# Patient Record
Sex: Female | Born: 1975 | Race: Black or African American | Hispanic: No | Marital: Married | Smoking: Never smoker
Health system: Southern US, Community
[De-identification: ages and names within clinical notes are randomized; demographics above are authoritative.]

## PROBLEM LIST (undated history)

## (undated) DIAGNOSIS — I10 Essential (primary) hypertension: Secondary | ICD-10-CM

## (undated) DIAGNOSIS — N840 Polyp of corpus uteri: Secondary | ICD-10-CM

## (undated) DIAGNOSIS — N809 Endometriosis, unspecified: Secondary | ICD-10-CM

## (undated) DIAGNOSIS — M549 Dorsalgia, unspecified: Secondary | ICD-10-CM

## (undated) DIAGNOSIS — L509 Urticaria, unspecified: Secondary | ICD-10-CM

## (undated) DIAGNOSIS — M25552 Pain in left hip: Secondary | ICD-10-CM

## (undated) DIAGNOSIS — M25551 Pain in right hip: Secondary | ICD-10-CM

## (undated) HISTORY — DX: Pain in left hip: M25.552

## (undated) HISTORY — DX: Essential (primary) hypertension: I10

## (undated) HISTORY — DX: Pain in right hip: M25.551

## (undated) HISTORY — DX: Urticaria, unspecified: L50.9

## (undated) HISTORY — DX: Dorsalgia, unspecified: M54.9

---

## 1997-08-13 ENCOUNTER — Emergency Department (HOSPITAL_COMMUNITY): Admission: EM | Admit: 1997-08-13 | Discharge: 1997-08-13 | Payer: Self-pay | Admitting: Emergency Medicine

## 1998-01-06 ENCOUNTER — Other Ambulatory Visit: Admission: RE | Admit: 1998-01-06 | Discharge: 1998-01-06 | Payer: Self-pay | Admitting: Obstetrics and Gynecology

## 1998-02-10 ENCOUNTER — Emergency Department (HOSPITAL_COMMUNITY): Admission: EM | Admit: 1998-02-10 | Discharge: 1998-02-10 | Payer: Self-pay | Admitting: Emergency Medicine

## 1998-07-30 ENCOUNTER — Inpatient Hospital Stay (HOSPITAL_COMMUNITY): Admission: AD | Admit: 1998-07-30 | Discharge: 1998-07-30 | Payer: Self-pay | Admitting: Obstetrics & Gynecology

## 1998-07-31 ENCOUNTER — Inpatient Hospital Stay (HOSPITAL_COMMUNITY): Admission: AD | Admit: 1998-07-31 | Discharge: 1998-08-03 | Payer: Self-pay | Admitting: Obstetrics & Gynecology

## 1999-02-08 ENCOUNTER — Other Ambulatory Visit: Admission: RE | Admit: 1999-02-08 | Discharge: 1999-02-08 | Payer: Self-pay | Admitting: Obstetrics and Gynecology

## 1999-03-04 ENCOUNTER — Encounter: Payer: Self-pay | Admitting: *Deleted

## 1999-03-04 ENCOUNTER — Emergency Department (HOSPITAL_COMMUNITY): Admission: EM | Admit: 1999-03-04 | Discharge: 1999-03-04 | Payer: Self-pay | Admitting: *Deleted

## 1999-12-26 ENCOUNTER — Encounter: Admission: RE | Admit: 1999-12-26 | Discharge: 1999-12-26 | Payer: Self-pay | Admitting: Family Medicine

## 2000-01-08 ENCOUNTER — Encounter: Admission: RE | Admit: 2000-01-08 | Discharge: 2000-01-08 | Payer: Self-pay | Admitting: Family Medicine

## 2000-02-05 ENCOUNTER — Encounter: Admission: RE | Admit: 2000-02-05 | Discharge: 2000-02-05 | Payer: Self-pay | Admitting: Family Medicine

## 2000-03-05 ENCOUNTER — Other Ambulatory Visit: Admission: RE | Admit: 2000-03-05 | Discharge: 2000-03-05 | Payer: Self-pay | Admitting: Family Medicine

## 2000-03-05 ENCOUNTER — Encounter: Admission: RE | Admit: 2000-03-05 | Discharge: 2000-03-05 | Payer: Self-pay | Admitting: Family Medicine

## 2001-02-05 ENCOUNTER — Encounter: Admission: RE | Admit: 2001-02-05 | Discharge: 2001-02-05 | Payer: Self-pay | Admitting: Family Medicine

## 2001-02-23 ENCOUNTER — Encounter (INDEPENDENT_AMBULATORY_CARE_PROVIDER_SITE_OTHER): Payer: Self-pay | Admitting: *Deleted

## 2001-02-23 LAB — CONVERTED CEMR LAB

## 2001-02-26 ENCOUNTER — Encounter: Admission: RE | Admit: 2001-02-26 | Discharge: 2001-02-26 | Payer: Self-pay | Admitting: Family Medicine

## 2001-03-12 ENCOUNTER — Encounter: Admission: RE | Admit: 2001-03-12 | Discharge: 2001-03-12 | Payer: Self-pay | Admitting: Family Medicine

## 2001-07-30 ENCOUNTER — Encounter: Admission: RE | Admit: 2001-07-30 | Discharge: 2001-07-30 | Payer: Self-pay | Admitting: Family Medicine

## 2001-08-15 ENCOUNTER — Encounter: Admission: RE | Admit: 2001-08-15 | Discharge: 2001-08-15 | Payer: Self-pay | Admitting: Family Medicine

## 2003-12-21 ENCOUNTER — Encounter (INDEPENDENT_AMBULATORY_CARE_PROVIDER_SITE_OTHER): Payer: Self-pay | Admitting: Specialist

## 2003-12-21 ENCOUNTER — Ambulatory Visit (HOSPITAL_COMMUNITY): Admission: RE | Admit: 2003-12-21 | Discharge: 2003-12-21 | Payer: Self-pay | Admitting: Otolaryngology

## 2003-12-21 HISTORY — PX: TONSILLECTOMY: SUR1361

## 2003-12-27 ENCOUNTER — Observation Stay (HOSPITAL_COMMUNITY): Admission: EM | Admit: 2003-12-27 | Discharge: 2003-12-28 | Payer: Self-pay | Admitting: Emergency Medicine

## 2004-04-26 ENCOUNTER — Other Ambulatory Visit: Admission: RE | Admit: 2004-04-26 | Discharge: 2004-04-26 | Payer: Self-pay | Admitting: Family Medicine

## 2005-06-21 ENCOUNTER — Other Ambulatory Visit: Admission: RE | Admit: 2005-06-21 | Discharge: 2005-06-21 | Payer: Self-pay | Admitting: Family Medicine

## 2006-05-24 ENCOUNTER — Encounter (INDEPENDENT_AMBULATORY_CARE_PROVIDER_SITE_OTHER): Payer: Self-pay | Admitting: *Deleted

## 2006-09-24 ENCOUNTER — Other Ambulatory Visit: Admission: RE | Admit: 2006-09-24 | Discharge: 2006-09-24 | Payer: Self-pay | Admitting: Family Medicine

## 2010-08-11 NOTE — H&P (Signed)
NAME:  [name], [name] NO.:  [id number]   MEDICAL RECORD NO.:  [id number]          PATIENT TYPE:  OBV   LOCATION:  1830                         FACILITY:  [facility]   PHYSICIAN:  Alfonse Flavors, M.D.    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   HISTORY OF PRESENT ILLNESS:  Ms. Dareen Piano underwent a tonsillectomy by Dr.  Hermelinda Medicus six days ago.  She had the onset of bleeding tonight.  She  had been bleeding approximately two hours when seen in the emergency room.  She felt that she had bleed about 200 mL of blood.  She does not have any  other history of increased bleeding diathesis.   PAST SURGICAL HISTORY:  1.  Cesarean section.  2.  Tonsillectomy.   MEDICATIONS:  Ibuprofen, Lortab.   ALLERGIES:  PENICILLIN.   REVIEW OF SYMPTOMS:  Ms. Dareen Piano denies having any other current health  problems.   PHYSICAL EXAMINATION:  VITAL SIGNS:  Temperature was 100.4, blood pressure  is 148/103.  HEENT:  Tympanic membranes are normal bilaterally.  Examination of the  oropharynx showed clotted blood in the right tonsillar fossa.  CHEST:  Clear.  HEART:  S1 and S2 normal.  No murmur or gallop.  ABDOMEN:  Soft and nontender.  EXTREMITIES:  Normal.   IMPRESSION:  Tonsillar bleeding.   RECOMMENDATIONS:  Control of tonsillar bleeding under general anesthesia.       JCM/MEDQ  D:  12/27/2003  T:  12/28/2003  Job:  [id number]

## 2010-08-11 NOTE — Op Note (Signed)
NAME:  Marissa [name], Marissa [name]             ACCOUNT NO.:  [id number]   MEDICAL RECORD NO.:  [id number]          PATIENT TYPE:  OIB   LOCATION:  NA                           FACILITY:  [facility]   PHYSICIAN:  Hermelinda Medicus, M.D.   DATE OF BIRTH:  [birth year]   DATE OF PROCEDURE:  12/21/2003  DATE OF DISCHARGE:                                 OPERATIVE REPORT   PREOPERATIVE DIAGNOSIS:  Tonsillitis with tonsillar hypertrophy, with severe  snoring.   POSTOPERATIVE DIAGNOSES:  Tonsillitis with tonsillar hypertrophy, with  severe snoring.   OPERATION:  Tonsillectomy.   ANESTHESIA:  General endotracheal with Dr. Kaylyn Layer. [place].   SURGEON:  Hermelinda Medicus, M.D.   DESCRIPTION OF PROCEDURE:  The patient was placed in the supine position.  Under general endotracheal anesthesia the tonsillar gag was placed.  The  patient's tonsils which were exudative and protruding were evaluated.  The  tonsil was grasped using a tenaculum.  The Bovie electrocoagulation and  blunt dissection was used to begin the dissection, and then all hemostasis  was established with Bovie electrocoagulation as this dissection was  continued.  The tonsil was slowly removed using the blunt dissection and the  electrocoagulation on the left, and similarly on the right.  These were  separate and sent for microscopic evaluation.  The tonsillar beds were  carefully evaluated and made sure that all hemostasis was established.  The  stomach was suctioned using a #18 [place] sump, and then the patient was  awakened.  The patient tolerated the procedure well, and is doing well postoperatively.       JC/MEDQ  D:  12/21/2003  T:  12/21/2003  Job:  [id number]   cc:   Dr. [facility]

## 2010-08-11 NOTE — H&P (Signed)
NAME:  Marissa [name], LOS NO.:  [id number]   MEDICAL RECORD NO.:  [id number]          PATIENT TYPE:  OIB   LOCATION:  NA                           FACILITY:  [facility]   PHYSICIAN:  Hermelinda Medicus, M.D.   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 She is 35 years old  and she has had considerable tonsillitis in the past going back to her  teenage years. 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 She has been on multiple antibiotics in the past. She  has now developed an allergy to PENICILLIN and gets hives from this. She  says the only thing that works for her is Avelox or Levaquin, which she is  taking right now. Under my care she has developed a course of tonsillitis.  She also has had a history of severe snoring. She has not had a sleep apnea  study, a polysomnogram, because her family states that she is not truly  obstructing, but the snoring is also waking her up, interrupting her sleep  causing considerable discomfort.   PAST MEDICAL HISTORY:  PENICILLIN allergy with hives. She does not smoke and  does not drink. She had a Cesarean section in 1995. She did not have any  pulmonary, GI, or GU problems.   She is a well-nourished, well-developed female. She does have somewhat of a  labile hypertension-type problem where she comes in with a blood pressure of  120/80 and 118/60 in the office. She says it has been as high as 170/100,  and today before surgery she is 149/95.  She weighs 176 and is 61 inches.  Her ears are clear.  Tympanic membranes are clear. The nose is clear. No  ulceration or mass. The throat is clear except for exudative tonsils with  light cheesy-type debris in the tonsillar crypts. The larynx is clear.  True  cords and false cords, epiglottis, and facial nerves are clear without any  ulceration or mass. Her neck is free of any thyromegaly or cervical  adenopathy, or mass. Chest is clear with no rales, rhonchi, or wheezes.  Cardiovascular without murmurs, rubs, or gallops. True cord with bloody gag  reflex.  No bloody EOMs. Facial nerve and shoulder strength were all  symmetrical on radial nerve testing.   INITIAL DIAGNOSIS:  Tonsillitis with severe snoring with a history of  persistent tonsillitis unresolving on antibiotics.       JC/MEDQ  D:  12/21/2003  T:  12/21/2003  Job:  [id number]   cc:   [facility]   Dr. Kinnie Feil of [facility]

## 2010-08-11 NOTE — Op Note (Signed)
NAME:  Marissa [name], Marissa [name] NO.:  [id number]   MEDICAL RECORD NO.:  [id number]          PATIENT TYPE:  OBV   LOCATION:  5019                         FACILITY:  [facility]   PHYSICIAN:  Alfonse Flavors, M.D.    DATE OF BIRTH:  1975/04/14   DATE OF PROCEDURE:  12/27/2003  DATE OF DISCHARGE:                                 OPERATIVE REPORT   INDICATION AND JUSTIFICATION FOR PROCEDURE:  Remmi Armenteros is a 35-year-  old patient who underwent a tonsillectomy by Dr. Hermelinda Medicus 6 days ago.  She contacted me tonight as the on-call physician to report that she has had  2 hours of bleeding.  She was seen in the emergency room and was noted to  have a clot in the right tonsillar fossa and to be spitting out bright red  blood.  She was a candidate for control of tonsillar bleeding under general  anesthesia and the indications and complications, including persistent  bleeding, were discussed with her and operative permit was obtained.   PREOPERATIVE DIAGNOSIS:  Tonsillar bleeding.   POSTOPERATIVE DIAGNOSIS:  Tonsillar bleeding.   PROCEDURE PERFORMED:  Control of right tonsillar bleeding.   SURGEON:  Alfonse Flavors, M.D.   ANESTHESIA:  General endotracheal.   DESCRIPTION:  Ms. Dareen Piano was brought to the operating room and placed  supine on the operating table.  She was induced with general anesthesia and  intubated with an orotracheal tube.  Her face was draped in a sterile  fashion and mouth was opened with a Crowe-Davis mouth gag.  Examination of  the oropharynx showed a large amount of clotted blood in the right tonsillar  fossa; this material was removed with suction.  She had moderate oozing from  the inferior fossa which was controlled with suction cautery.  There was a  small protuberant area in the midportion of the tonsillar fossa.  This was  cleaned with suction and bright red arterial bleeding occurred.  This  bleeding was also controlled with suction cautery.  The  pharynx was  suctioned free of debris.  There did not appear to be any bleeding from the  left side.  The tonsillar fossa was abraded with the tip of a finger and  suction cautery; no further bleeding could be induced.  The hypopharynx was  cleaned with suction and a nasogastric tube was passed into the stomach and  the gastric contents were evacuated.  Ms. Anderson tolerated the procedure  well and was taken to the recovery area in satisfactory condition.   FOLLOWUP CARE:  Ms. Dareen Piano will be admitted for overnight observation.  Her discharge is anticipated in the morning.  She will continue medications  as prescribed by Dr. Haroldine Laws.      JCM/MEDQ  D:  12/27/2003  T:  12/28/2003  Job:  [id number]

## 2012-06-23 ENCOUNTER — Ambulatory Visit (INDEPENDENT_AMBULATORY_CARE_PROVIDER_SITE_OTHER): Payer: 59 | Admitting: Obstetrics & Gynecology

## 2012-06-25 ENCOUNTER — Ambulatory Visit (INDEPENDENT_AMBULATORY_CARE_PROVIDER_SITE_OTHER): Payer: 59 | Admitting: Obstetrics & Gynecology

## 2012-06-25 ENCOUNTER — Encounter: Payer: Self-pay | Admitting: Obstetrics & Gynecology

## 2012-06-25 VITALS — BP 124/89 | HR 89 | Temp 97.9°F | Ht 61.0 in | Wt 170.0 lb

## 2012-06-25 DIAGNOSIS — Z30432 Encounter for removal of intrauterine contraceptive device: Secondary | ICD-10-CM

## 2012-06-25 DIAGNOSIS — I10 Essential (primary) hypertension: Secondary | ICD-10-CM | POA: Insufficient documentation

## 2012-06-25 DIAGNOSIS — Z309 Encounter for contraceptive management, unspecified: Secondary | ICD-10-CM

## 2012-06-25 MED ORDER — ETONOGESTREL-ETHINYL ESTRADIOL 0.12-0.015 MG/24HR VA RING: VAGINAL_RING | VAGINAL | Status: DC

## 2012-06-25 NOTE — Patient Instructions (Signed)

## 2012-06-25 NOTE — Progress Notes (Signed)
.   Subjective:     Marissa [name] is a 37 y.o. female here for a routine exam.  She would like to have her Mirena IUD removed today - it was inserted 5 years ago.  No other complaints at this time.  Personal health questionnaire reviewed: no.   Gynecologic History No LMP recorded. Patient is not currently having periods (Reason: IUD). Contraception: Mirena Last Pap: 2013 Results were: normal Last mammogram: N/A  Obstetric History OB History   Grav Para Term Preterm Abortions TAB SAB Ect Mult Living   3 3 3       3      # Outc Date GA Lbr Len/2nd Wgt Sex Del Anes PTL Lv   1 TRM 9/95 [redacted]w[redacted]d  7lb6oz(3.345kg) M LTCS   Yes   Comments: Low heart rate during contraction   2 TRM 5/00 [redacted]w[redacted]d  9lb4oz(4.196kg) M SVD EPI  Yes   3 TRM 4/03 [redacted]w[redacted]d  7lb12oz(3.515kg) M SVD EPI  Yes       The following portions of the patient's history were reviewed and updated as appropriate: allergies, current medications, past family history, past medical history, past social history, past surgical history and problem list.  Review of Systems Pertinent items are noted in HPI.    Objective:     SPEC:  IUD strings seen.  IUD removed intact.     Assessment:   S/P IUD removal  Plan:    Contraception: Paediatric nurse.   Re-check B/P in several mths Recommend folic acid

## 2012-07-03 ENCOUNTER — Institutional Professional Consult (permissible substitution): Payer: Self-pay | Admitting: Cardiovascular Disease

## 2012-07-16 ENCOUNTER — Ambulatory Visit: Payer: Self-pay | Admitting: Obstetrics & Gynecology

## 2012-12-29 ENCOUNTER — Ambulatory Visit: Payer: 59 | Admitting: Obstetrics & Gynecology

## 2013-01-02 ENCOUNTER — Ambulatory Visit: Payer: 59 | Admitting: Advanced Practice Midwife

## 2013-01-23 ENCOUNTER — Ambulatory Visit: Payer: 59 | Admitting: Advanced Practice Midwife

## 2013-03-13 ENCOUNTER — Ambulatory Visit (INDEPENDENT_AMBULATORY_CARE_PROVIDER_SITE_OTHER): Payer: BC Managed Care – PPO | Admitting: Advanced Practice Midwife

## 2013-03-13 ENCOUNTER — Encounter: Payer: Self-pay | Admitting: Advanced Practice Midwife

## 2013-03-13 VITALS — BP 144/100 | HR 76 | Temp 98.3°F | Wt 182.0 lb

## 2013-03-13 DIAGNOSIS — Z01419 Encounter for gynecological examination (general) (routine) without abnormal findings: Secondary | ICD-10-CM

## 2013-03-13 DIAGNOSIS — Z98891 History of uterine scar from previous surgery: Secondary | ICD-10-CM

## 2013-03-13 DIAGNOSIS — Z3169 Encounter for other general counseling and advice on procreation: Secondary | ICD-10-CM | POA: Insufficient documentation

## 2013-03-13 NOTE — Progress Notes (Signed)
Pt is in office for abnormal bleeding. Pt is currently trying to conceive and it concerned about bleeding and ovulation.

## 2013-03-13 NOTE — Progress Notes (Signed)
2Subjective:     Marissa [name] is a 37 y.o. female and is here for a comprehensive physical exam. The patient reports bleeding following intercourse on day 11 of cycle.  Patient currently trying to conceive. She is in a new marriage. She has 3 children (youngest 10yo) and her husband has 1 (3yo). They have not been preventing for 6 months and actively trying for the past 3 months. She reports recent bleeding after IC for 1 day, then went back to normal. Her cycles vary from 21-27 days in duration. She denies any premenopausal symptoms. Denies other concerns.  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   History   Social History  . Marital Status: Single    Spouse Name: N/A    Number of Children: N/A  . Years of Education: N/A   Occupational History  . Not on file.   Social History Main Topics  . Smoking status: Never Smoker   . Smokeless tobacco: Not on file  . Alcohol Use: Yes     Comment: occasional  . Drug Use: No  . Sexual Activity: Yes    Birth Control/ Protection: None   Other Topics Concern  . Not on file   Social History Narrative  . No narrative on file   Health Maintenance  Topic Date Due  . Tetanus/tdap  02/27/1996  . Pap Smear  02/24/2004  . Influenza Vaccine  10/24/2012    The following portions of the patient's history were reviewed and updated as appropriate: allergies, current medications, past family history, past medical history, past social history, past surgical history and problem list.  Review of Systems A comprehensive review of systems was negative.   Objective:    BP 144/100  Pulse 76  Temp(Src) 98.3 F (36.8 C)  Wt 182 lb (82.555 kg)  LMP 02/25/2013 General appearance: alert and cooperative Head: Normocephalic, without obvious abnormality, atraumatic Eyes: conjunctivae/corneas clear. PERRL, EOM's intact. Fundi benign. Ears: normal TM's and external ear canals both ears Nose: Nares normal. Septum midline. Mucosa normal. No  drainage or sinus tenderness. Throat: lips, mucosa, and tongue normal; teeth and gums normal Neck: no adenopathy, no carotid bruit, no JVD, supple, symmetrical, trachea midline and thyroid not enlarged, symmetric, no tenderness/mass/nodules Back: symmetric, no curvature. ROM normal. No CVA tenderness. Lungs: clear to auscultation bilaterally Breasts: normal appearance, no masses or tenderness Heart: regular rate and rhythm, S1, S2 normal, no murmur, click, rub or gallop Abdomen: soft, non-tender; bowel sounds normal; no masses,  no organomegaly Pelvic: cervix normal in appearance, external genitalia normal, no adnexal masses or tenderness, no cervical motion tenderness, rectovaginal septum normal, uterus normal size, shape, and consistency and vagina normal without discharge 3 Nabothian Cyst on cervix 2 @ 1 o'clock and 1 @ 3 o'clock. Cervix friable w/ pap smear Extremities: extremities normal, atraumatic, no cyanosis or edema Pulses: 2+ and symmetric Skin: Skin color, texture, turgor normal. No rashes or lesions Neurologic: Grossly normal    Assessment:    Healthy female exam.  Advanced Maternal Age Hypertension Desires Fertility Previous C-section w/ 2 subsequent VBAC Friable cervix     Plan:     See After Visit Summary for Counseling Recommendations  Encouraged patient to take HTN medication, Category B Encouraged PNV (patient taking) Discussed FAM, discussed methods to help w/ success of fertility Patient to RTC for evaluation if not successful after 6 months of active trying. Pap, GC/Ct today Patient may RTC for lab work for preventative labs CBC, CMP,  TSH, Cholesterol Discussed high risk pregnancy to patient including AMA, HTN.  40 min spent with patient greater than 80% spent in counseling and coordination of care.   Sybel Standish Wilson Singer CNM

## 2013-03-14 LAB — GC/CHLAMYDIA PROBE AMP
CT Probe RNA: NEGATIVE
GC Probe RNA: NEGATIVE

## 2013-03-16 LAB — PAP IG AND HPV HIGH-RISK: HPV DNA High Risk: NOT DETECTED

## 2013-03-27 ENCOUNTER — Encounter: Payer: Self-pay | Admitting: Advanced Practice Midwife

## 2013-05-01 ENCOUNTER — Encounter: Payer: Self-pay | Admitting: Advanced Practice Midwife

## 2013-05-01 ENCOUNTER — Ambulatory Visit (INDEPENDENT_AMBULATORY_CARE_PROVIDER_SITE_OTHER): Payer: BC Managed Care – PPO | Admitting: Advanced Practice Midwife

## 2013-05-01 VITALS — BP 126/84 | HR 69 | Temp 98.1°F | Ht 60.0 in | Wt 181.0 lb

## 2013-05-01 DIAGNOSIS — IMO0002 Reserved for concepts with insufficient information to code with codable children: Secondary | ICD-10-CM

## 2013-05-01 DIAGNOSIS — Z01419 Encounter for gynecological examination (general) (routine) without abnormal findings: Secondary | ICD-10-CM

## 2013-05-01 DIAGNOSIS — N939 Abnormal uterine and vaginal bleeding, unspecified: Secondary | ICD-10-CM | POA: Insufficient documentation

## 2013-05-01 DIAGNOSIS — N926 Irregular menstruation, unspecified: Secondary | ICD-10-CM

## 2013-05-01 LAB — TRIGLYCERIDES: TRIGLYCERIDES: 59 mg/dL (ref <150)

## 2013-05-01 LAB — CBC WITH DIFFERENTIAL/PLATELET
BASOS ABS: 0.1 10*3/uL (ref 0.0–0.1)
Basophils Relative: 1 % (ref 0–1)
EOS PCT: 2 % (ref 0–5)
Eosinophils Absolute: 0.2 10*3/uL (ref 0.0–0.7)
HEMATOCRIT: 41.4 % (ref 36.0–46.0)
HEMOGLOBIN: 13.9 g/dL (ref 12.0–15.0)
LYMPHS ABS: 2.1 10*3/uL (ref 0.7–4.0)
LYMPHS PCT: 27 % (ref 12–46)
MCH: 29.6 pg (ref 26.0–34.0)
MCHC: 33.6 g/dL (ref 30.0–36.0)
MCV: 88.3 fL (ref 78.0–100.0)
MONOS PCT: 6 % (ref 3–12)
Monocytes Absolute: 0.4 10*3/uL (ref 0.1–1.0)
NEUTROS ABS: 5 10*3/uL (ref 1.7–7.7)
Neutrophils Relative %: 64 % (ref 43–77)
Platelets: 286 10*3/uL (ref 150–400)
RBC: 4.69 MIL/uL (ref 3.87–5.11)
RDW: 13.8 % (ref 11.5–15.5)
WBC: 7.7 10*3/uL (ref 4.0–10.5)

## 2013-05-01 LAB — LDL CHOLESTEROL, DIRECT: Direct LDL: 114 mg/dL — ABNORMAL HIGH

## 2013-05-01 LAB — COMPREHENSIVE METABOLIC PANEL
ALBUMIN: 4.2 g/dL (ref 3.5–5.2)
ALT: 11 U/L (ref 0–35)
AST: 12 U/L (ref 0–37)
Alkaline Phosphatase: 70 U/L (ref 39–117)
BUN: 9 mg/dL (ref 6–23)
CHLORIDE: 103 meq/L (ref 96–112)
CO2: 26 mEq/L (ref 19–32)
Calcium: 9.4 mg/dL (ref 8.4–10.5)
Creat: 0.74 mg/dL (ref 0.50–1.10)
Glucose, Bld: 71 mg/dL (ref 70–99)
POTASSIUM: 4.2 meq/L (ref 3.5–5.3)
Sodium: 140 mEq/L (ref 135–145)
Total Bilirubin: 0.5 mg/dL (ref 0.2–1.2)
Total Protein: 7.2 g/dL (ref 6.0–8.3)

## 2013-05-01 LAB — HDL CHOLESTEROL: HDL: 52 mg/dL (ref >39)

## 2013-05-01 LAB — TSH: TSH: 1.42 u[IU]/mL (ref 0.350–4.500)

## 2013-05-01 LAB — T3, FREE: T3, Free: 2.9 pg/mL (ref 2.3–4.2)

## 2013-05-01 LAB — POCT URINE PREGNANCY: Preg Test, Ur: NEGATIVE

## 2013-05-01 LAB — T4, FREE: FREE T4: 1.08 ng/dL (ref 0.80–1.80)

## 2013-05-01 LAB — CHOLESTEROL, TOTAL: Cholesterol: 178 mg/dL (ref 0–200)

## 2013-05-01 NOTE — Progress Notes (Signed)
Subjective:     Marissa [name] is a 38 y.o. female here for a routine exam.  Current complaints: Pt states that she has had abnormal bleeding this past cycle.  Pt states that this is the first occurrence. Pt states that her cycles are regular, 23-25 days period that last for approx 5 days.  Pt states that her cycle started a few days early and lasted for 6 days.  Pt states she then had 2 days with no bleeding and had intercourse.  Pt then started cycle again on the third day.  Pt states that she did have some pain during intercourse.  Pt denies any other vaginal or urinary problems.  Pt states that she is trying to conceive and would like to discuss.  Personal health questionnaire reviewed: yes.  [name] reported new onset of pain w/ bleeding. Concerned with abnormal bleeding, pain and trying to conceive.    Gynecologic History Patient's last menstrual period was 04/14/2013. Contraception: none Last Pap: 02/2013. Results were: normal  Obstetric History OB History  Gravida Para Term Preterm AB SAB TAB Ectopic Multiple Living  3 3 3       3     # Outcome Date GA Lbr Len/2nd Weight Sex Delivery Anes PTL Lv  3 TRM 06/30/01 [redacted]w[redacted]d  7 lb 12 oz (3.515 kg) M SVD EPI  Y  2 TRM 08/02/98 [redacted]w[redacted]d  9 lb 4 oz (4.196 kg) M SVD EPI  Y  1 TRM 12/01/93 [redacted]w[redacted]d  7 lb 6 oz (3.345 kg) M LTCS   Y     Comments: Low heart rate during contraction       The following portions of the patient's history were reviewed and updated as appropriate: allergies, current medications, past family history, past medical history, past social history, past surgical history and problem list.  Review of Systems A comprehensive review of systems was negative except for: Constitutional: positive for fatigue Genitourinary: positive for abnormal menstrual periods and dysparunia Behavioral/Psych: positive for tired and fatigued    Objective:    BP 126/84  Pulse 69  Temp(Src) 98.1 F (36.7 C)  Ht 5' (1.524 m)  Wt 181 lb (82.101  kg)  BMI 35.35 kg/m2  LMP 04/14/2013 General appearance: alert and cooperative    Assessment:   Patient Active Problem List   Diagnosis Date Noted  . Abnormal uterine bleeding 05/01/2013  . Encounter for preconception consultation 03/13/2013  . H/O: C-section 03/13/2013  . Essential hypertension 06/25/2012  Trying to conceive   Plan:    Follow up in: 2 months.   Pelvic US at this time.  TSH and wellness labs drawn (fasting) Patient to RTC to meet w/ MD to discuss further work up if she continue having bleeding.  Follow PCP for hypertension.  See previous notes  20 min spent with patient greater than 80% spent in counseling and coordination of care.   Shiva Karis Roni Bread CNM

## 2013-05-08 ENCOUNTER — Ambulatory Visit (HOSPITAL_COMMUNITY)
Admission: RE | Admit: 2013-05-08 | Discharge: 2013-05-08 | Disposition: A | Discharge status: Home / Self Care | Payer: BC Managed Care – PPO | Source: Ambulatory Visit | Attending: Advanced Practice Midwife | Admitting: Advanced Practice Midwife

## 2013-05-08 ENCOUNTER — Ambulatory Visit (HOSPITAL_COMMUNITY): Payer: BC Managed Care – PPO

## 2013-05-08 ENCOUNTER — Other Ambulatory Visit: Payer: Self-pay | Admitting: Advanced Practice Midwife

## 2013-05-08 DIAGNOSIS — D250 Submucous leiomyoma of uterus: Secondary | ICD-10-CM | POA: Insufficient documentation

## 2013-05-08 DIAGNOSIS — N939 Abnormal uterine and vaginal bleeding, unspecified: Secondary | ICD-10-CM

## 2013-05-08 DIAGNOSIS — N938 Other specified abnormal uterine and vaginal bleeding: Secondary | ICD-10-CM | POA: Insufficient documentation

## 2013-05-08 DIAGNOSIS — I10 Essential (primary) hypertension: Secondary | ICD-10-CM | POA: Insufficient documentation

## 2013-05-08 DIAGNOSIS — N949 Unspecified condition associated with female genital organs and menstrual cycle: Secondary | ICD-10-CM | POA: Insufficient documentation

## 2013-05-08 DIAGNOSIS — N83209 Unspecified ovarian cyst, unspecified side: Secondary | ICD-10-CM | POA: Insufficient documentation

## 2013-05-19 ENCOUNTER — Encounter: Payer: Self-pay | Admitting: Advanced Practice Midwife

## 2013-05-19 DIAGNOSIS — Z3490 Encounter for supervision of normal pregnancy, unspecified, unspecified trimester: Secondary | ICD-10-CM | POA: Insufficient documentation

## 2013-05-19 DIAGNOSIS — N83201 Unspecified ovarian cyst, right side: Secondary | ICD-10-CM | POA: Insufficient documentation

## 2013-05-20 ENCOUNTER — Inpatient Hospital Stay (HOSPITAL_COMMUNITY): Payer: BC Managed Care – PPO

## 2013-05-20 ENCOUNTER — Inpatient Hospital Stay (HOSPITAL_COMMUNITY)
Admission: AD | Admit: 2013-05-20 | Discharge: 2013-05-20 | Disposition: A | Discharge status: Home / Self Care | Payer: BC Managed Care – PPO | Source: Ambulatory Visit | Attending: Obstetrics & Gynecology | Admitting: Obstetrics & Gynecology

## 2013-05-20 ENCOUNTER — Encounter (HOSPITAL_COMMUNITY): Payer: Self-pay

## 2013-05-20 DIAGNOSIS — R1031 Right lower quadrant pain: Secondary | ICD-10-CM | POA: Insufficient documentation

## 2013-05-20 DIAGNOSIS — O039 Complete or unspecified spontaneous abortion without complication: Secondary | ICD-10-CM

## 2013-05-20 LAB — WET PREP, GENITAL
Clue Cells Wet Prep HPF POC: NONE SEEN
TRICH WET PREP: NONE SEEN
YEAST WET PREP: NONE SEEN

## 2013-05-20 LAB — ABO/RH: ABO/RH(D): B POS

## 2013-05-20 LAB — POCT PREGNANCY, URINE: Preg Test, Ur: NEGATIVE

## 2013-05-20 MED ORDER — LIDOCAINE 1%/NA BICARB 0.1 MEQ INJECTION
INJECTION | INTRAVENOUS | Status: AC
  Filled 2013-05-20: qty 1

## 2013-05-20 NOTE — MAU Provider Note (Signed)
History     CSN: [id number]  Arrival date and time: 05/20/13 9528   First Provider Initiated Contact with Patient 05/20/13 1005      Chief Complaint  Patient presents with  . Abdominal Pain  . Vaginal Bleeding   Abdominal Pain Pertinent negatives include no fever, nausea or vomiting.  Vaginal Bleeding Associated symptoms include abdominal pain (RLQ). Pertinent negatives include no chills, fever, nausea or vomiting.   This is a 38 y.o. female at [redacted]w[redacted]d by LMP who presents with c/o bleeding and RLQ pain, which both started today.   Just had a qualitative pregnancy test done at her family doctor this week. It was positive.  Did not do a quantitative test.   RN Note:   Patient states she had a positive blood pregnancy test in the office on 2-23. Started having heavy vaginal bleeding and pain on the right lower side.        OB History   Grav Para Term Preterm Abortions TAB SAB Ect Mult Living   4 3 3       3       Past Medical History  Diagnosis Date  . Hypertension     Past Surgical History  Procedure Laterality Date  . Cesarean section    . Tonsillectomy      Family History  Problem Relation Age of Onset  . Hypertension Paternal Grandmother   . Stroke Paternal Grandmother     History  Substance Use Topics  . Smoking status: Never Smoker   . Smokeless tobacco: Not on file  . Alcohol Use: Yes     Comment: occasional    Allergies:  Allergies  Allergen Reactions  . Penicillins Hives    Prescriptions prior to admission  Medication Sig Dispense Refill  . Biotin ([employer] BIOTIN HIGH POTENCY) 1000 MCG tablet Take 1,000 mcg by mouth daily.      Marland Kitchen etonogestrel-ethinyl estradiol (NUVARING) 0.12-0.015 MG/24HR vaginal ring Insert vaginally and leave in place for 3 consecutive weeks, then remove for 1 week.  1 each  12  . hydrochlorothiazide (HYDRODIURIL) 25 MG tablet Take 25 mg by mouth daily.      . Multiple Vitamins-Minerals (MULTIVITAMIN WITH MINERALS) tablet Take  1 tablet by mouth daily.        Review of Systems  Constitutional: Negative for fever, chills and malaise/fatigue.  Gastrointestinal: Positive for abdominal pain (RLQ). Negative for nausea and vomiting.  Genitourinary: Positive for vaginal bleeding.       Vaginal bleeding with clots    Physical Exam   Blood pressure 139/77, pulse 73, temperature 98.8 F (37.1 C), temperature source Oral, resp. rate 16, height 5\' 1"  (1.549 m), weight 82.101 kg (181 lb), last menstrual period 04/14/2013, SpO2 100.00%.  Physical Exam  Constitutional: She is oriented to person, place, and time. She appears well-developed and well-nourished. No distress.  Cardiovascular: Normal rate.   Respiratory: Effort normal.  GI: Soft. She exhibits no distension and no mass. There is tenderness (Right lower quadrant, no CMT, uterus nontender). There is no rebound and no guarding.  Genitourinary: Uterus normal. Vaginal discharge (moderate blood in vault) found.  Musculoskeletal: Normal range of motion.  Neurological: She is alert and oriented to person, place, and time.  Skin: Skin is warm and dry.  Psychiatric: She has a normal mood and affect.    MAU Course  Procedures  MDM Cultures done, will check labs and [place].  Quant HCG was <1.  Assessment and Plan  A:  Complete  Spontaneous Abortion       Negative blood pregnancy test today  P: Discussed results.      Quant was probably low earlier in the week      Followup in office  [facility] 05/20/2013, 10:12 AM

## 2013-05-20 NOTE — MAU Note (Signed)
Patient states she had a positive blood pregnancy test in the office on 2-23. Started having heavy vaginal bleeding and pain on the right lower side.

## 2013-05-20 NOTE — Discharge Instructions (Signed)
Miscarriage A miscarriage is the sudden loss of an unborn baby (fetus) before the 20th week of pregnancy. Most miscarriages happen in the first 3 months of pregnancy. Sometimes, it happens before a woman even knows she is pregnant. A miscarriage is also called a "spontaneous miscarriage" or "early pregnancy loss." Having a miscarriage can be an emotional experience. Talk with your caregiver about any questions you may have about miscarrying, the grieving process, and your future pregnancy plans. CAUSES   Problems with the fetal chromosomes that make it impossible for the baby to develop normally. Problems with the baby's genes or chromosomes are most often the result of errors that occur, by chance, as the embryo divides and grows. The problems are not inherited from the parents.  Infection of the cervix or uterus.   Hormone problems.   Problems with the cervix, such as having an incompetent cervix. This is when the tissue in the cervix is not strong enough to hold the pregnancy.   Problems with the uterus, such as an abnormally shaped uterus, uterine fibroids, or congenital abnormalities.   Certain medical conditions.   Smoking, drinking alcohol, or taking illegal drugs.   Trauma.  Often, the cause of a miscarriage is unknown.  SYMPTOMS   Vaginal bleeding or spotting, with or without cramps or pain.  Pain or cramping in the abdomen or lower back.  Passing fluid, tissue, or blood clots from the vagina. DIAGNOSIS  Your caregiver will perform a physical exam. You may also have an ultrasound to confirm the miscarriage. Blood or urine tests may also be ordered. TREATMENT   Sometimes, treatment is not necessary if you naturally pass all the fetal tissue that was in the uterus. If some of the fetus or placenta remains in the body (incomplete miscarriage), tissue left behind may become infected and must be removed. Usually, a dilation and curettage (D and C) procedure is performed.  During a D and C procedure, the cervix is widened (dilated) and any remaining fetal or placental tissue is gently removed from the uterus.  Antibiotic medicines are prescribed if there is an infection. Other medicines may be given to reduce the size of the uterus (contract) if there is a lot of bleeding.  If you have Rh negative blood and your baby was Rh positive, you will need a Rh immunoglobulin shot. This shot will protect any future baby from having Rh blood problems in future pregnancies. HOME CARE INSTRUCTIONS   Your caregiver may order bed rest or may allow you to continue light activity. Resume activity as directed by your caregiver.  Have someone help with home and family responsibilities during this time.   Keep track of the number of sanitary pads you use each day and how soaked (saturated) they are. Write down this information.   Do not use tampons. Do not douche or have sexual intercourse until approved by your caregiver.   Only take over-the-counter or prescription medicines for pain or discomfort as directed by your caregiver.   Do not take aspirin. Aspirin can cause bleeding.   Keep all follow-up appointments with your caregiver.   If you or your partner have problems with grieving, talk to your caregiver or seek counseling to help cope with the pregnancy loss. Allow enough time to grieve before trying to get pregnant again.  SEEK IMMEDIATE MEDICAL CARE IF:   You have severe cramps or pain in your back or abdomen.  You have a fever.  You pass large blood clots (walnut-sized  or larger) ortissue from your vagina. Save any tissue for your caregiver to inspect.   Your bleeding increases.   You have a thick, bad-smelling vaginal discharge.  You become lightheaded, weak, or you faint.   You have chills.  MAKE SURE YOU:  Understand these instructions.  Will watch your condition.  Will get help right away if you are not doing well or get  worse. Document Released: 09/05/2000 Document Revised: 07/07/2012 Document Reviewed: 05/01/2011 [employer] Patient Information 2014 [employer].  

## 2013-05-21 LAB — GC/CHLAMYDIA PROBE AMP
CT PROBE, AMP APTIMA: NEGATIVE
GC Probe RNA: NEGATIVE

## 2013-05-22 ENCOUNTER — Encounter: Payer: BC Managed Care – PPO | Admitting: Obstetrics & Gynecology

## 2013-05-25 LAB — HCG, QUANTITATIVE, PREGNANCY

## 2013-06-17 ENCOUNTER — Other Ambulatory Visit: Payer: Self-pay | Admitting: *Deleted

## 2013-06-17 DIAGNOSIS — N939 Abnormal uterine and vaginal bleeding, unspecified: Principal | ICD-10-CM

## 2013-06-17 DIAGNOSIS — N926 Irregular menstruation, unspecified: Secondary | ICD-10-CM

## 2013-06-25 ENCOUNTER — Ambulatory Visit (HOSPITAL_COMMUNITY): Payer: BC Managed Care – PPO

## 2013-06-26 ENCOUNTER — Ambulatory Visit (HOSPITAL_COMMUNITY)
Admission: RE | Admit: 2013-06-26 | Discharge: 2013-06-26 | Disposition: A | Discharge status: Home / Self Care | Payer: BC Managed Care – PPO | Source: Ambulatory Visit | Attending: Obstetrics & Gynecology | Admitting: Obstetrics & Gynecology

## 2013-06-26 DIAGNOSIS — N926 Irregular menstruation, unspecified: Secondary | ICD-10-CM

## 2013-06-26 DIAGNOSIS — R1031 Right lower quadrant pain: Secondary | ICD-10-CM | POA: Insufficient documentation

## 2013-06-26 DIAGNOSIS — N939 Abnormal uterine and vaginal bleeding, unspecified: Secondary | ICD-10-CM

## 2013-06-26 DIAGNOSIS — N83209 Unspecified ovarian cyst, unspecified side: Secondary | ICD-10-CM | POA: Insufficient documentation

## 2013-07-09 ENCOUNTER — Ambulatory Visit (INDEPENDENT_AMBULATORY_CARE_PROVIDER_SITE_OTHER): Payer: BC Managed Care – PPO | Admitting: Obstetrics & Gynecology

## 2013-07-09 ENCOUNTER — Encounter: Payer: Self-pay | Admitting: *Deleted

## 2013-07-09 ENCOUNTER — Encounter: Payer: Self-pay | Admitting: Obstetrics & Gynecology

## 2013-07-09 VITALS — BP 124/84 | HR 70 | Temp 98.2°F | Ht 60.0 in | Wt 182.0 lb

## 2013-07-09 DIAGNOSIS — N83209 Unspecified ovarian cyst, unspecified side: Secondary | ICD-10-CM

## 2013-07-09 DIAGNOSIS — N83201 Unspecified ovarian cyst, right side: Secondary | ICD-10-CM

## 2013-07-09 DIAGNOSIS — Z3169 Encounter for other general counseling and advice on procreation: Secondary | ICD-10-CM

## 2013-07-09 NOTE — Progress Notes (Signed)
Subjective:     Marissa [name] is a 38 y.o. female here for a routine exam.  Current complaints:Patient is in the office today for a follow up visit following a miscarriage and ultrasound results. Patient states she would discuss fertility issues.   The HPI was reviewed and explored in further detail by the provider. Gynecologic History Patient's last menstrual period was 07/05/2013. Contraception: none Last Pap: 03-13-2013. Results were: normal  Obstetric History OB History  Gravida Para Term Preterm AB SAB TAB Ectopic Multiple Living  4 3 3  1 1    3     # Outcome Date GA Lbr Len/2nd Weight Sex Delivery Anes PTL Lv  4 SAB 05/23/13 [redacted]w[redacted]d       N     Comments: System Generated. Please review and update pregnancy details.  3 TRM 06/30/01 [redacted]w[redacted]d  7 lb 12 oz (3.515 kg) M SVD EPI  Y  2 TRM 08/02/98 [redacted]w[redacted]d  9 lb 4 oz (4.196 kg) M SVD EPI  Y  1 TRM 12/01/93 [redacted]w[redacted]d  7 lb 6 oz (3.345 kg) M LTCS   Y     Comments: Low heart rate during contraction      Past Medical History  Diagnosis Date  . Hypertension     Past Surgical History  Procedure Laterality Date  . Cesarean section    . Tonsillectomy       (Not in a hospital admission) Allergies  Allergen Reactions  . Penicillins Hives    History  Substance Use Topics  . Smoking status: Never Smoker   . Smokeless tobacco: Never Used  . Alcohol Use: Yes     Comment: occasional    Family History  Problem Relation Age of Onset  . Hypertension Paternal Grandmother   . Stroke Paternal Grandmother   . Stroke Mother   . Hypertension Mother       Review of Systems  Constitutional: negative for fatigue and weight loss Respiratory: negative for cough and wheezing Cardiovascular: negative for chest pain, fatigue and palpitations Gastrointestinal: positive for abdominal pain and change in bowel habits Musculoskeletal:negative for myalgias Neurological: negative for gait problems and tremors Behavioral/Psych: negative for abusive  relationship, depression Endocrine: negative for temperature intolerance   Genitourinary:negative for abnormal menstrual periods, genital lesions, hot flashes, sexual problems and vaginal discharge Integument/breast: negative for breast lump, breast tenderness, nipple discharge and skin lesion(s)    Objective:        Lab Review  Radiologic studies reviewed yes  100% of 15 min visit spent on counseling and coordination of care.   Assessment:      Patient Active Problem List   Diagnosis Date Noted  . Ovarian cyst, right 05/19/2013    05/01/2013  . Encounter for preconception consultation 03/13/2013    03/13/2013    06/25/2012   Minimal symptoms from the ovarian cyst Plan:  Reassured re: fertility potential Continue efforts for weight loss/to conceive/folic acid Repeat U/S 5/78 Return in 6 mths or for positive UPT/prn

## 2013-09-30 ENCOUNTER — Other Ambulatory Visit: Payer: Self-pay | Admitting: *Deleted

## 2013-09-30 DIAGNOSIS — N939 Abnormal uterine and vaginal bleeding, unspecified: Secondary | ICD-10-CM

## 2013-09-30 DIAGNOSIS — N83201 Unspecified ovarian cyst, right side: Secondary | ICD-10-CM

## 2013-10-06 ENCOUNTER — Other Ambulatory Visit: Payer: BC Managed Care – PPO

## 2013-10-09 ENCOUNTER — Ambulatory Visit (HOSPITAL_COMMUNITY)
Admission: RE | Admit: 2013-10-09 | Discharge: 2013-10-09 | Disposition: A | Discharge status: Home / Self Care | Payer: BC Managed Care – PPO | Source: Ambulatory Visit | Attending: Obstetrics & Gynecology | Admitting: Obstetrics & Gynecology

## 2013-10-09 DIAGNOSIS — N83201 Unspecified ovarian cyst, right side: Secondary | ICD-10-CM

## 2013-10-09 DIAGNOSIS — N939 Abnormal uterine and vaginal bleeding, unspecified: Secondary | ICD-10-CM

## 2013-10-09 DIAGNOSIS — N83209 Unspecified ovarian cyst, unspecified side: Secondary | ICD-10-CM | POA: Insufficient documentation

## 2013-10-12 ENCOUNTER — Telehealth: Payer: Self-pay | Admitting: *Deleted

## 2013-10-12 NOTE — Telephone Encounter (Signed)
Pt called requesting u/s results.  Call placed to pt making her aware that Dr Delsa Sale would have to review u/s and give her findings if any.  Pt made aware to expect a return call from [place] once reviewed.  Please review most recent u/s and advise on results.

## 2013-10-13 NOTE — Telephone Encounter (Signed)
The U/S showed resolution of the previously seen ovarian cyst.  OK to call pt w/result

## 2013-10-14 NOTE — Telephone Encounter (Signed)
Pt made aware of findings per Marissa [name].  Pt has no other concerns.

## 2013-11-19 ENCOUNTER — Encounter: Payer: Self-pay | Admitting: Obstetrics & Gynecology

## 2013-11-19 ENCOUNTER — Ambulatory Visit (INDEPENDENT_AMBULATORY_CARE_PROVIDER_SITE_OTHER): Payer: BC Managed Care – PPO | Admitting: Obstetrics & Gynecology

## 2013-11-19 VITALS — Ht 60.0 in | Wt 188.0 lb

## 2013-11-19 DIAGNOSIS — Z23 Encounter for immunization: Secondary | ICD-10-CM

## 2013-11-19 DIAGNOSIS — Z3169 Encounter for other general counseling and advice on procreation: Secondary | ICD-10-CM

## 2013-11-19 NOTE — Patient Instructions (Signed)
Semen Analysis A semen analysis test investigates some aspects of the health of a man's reproductive organs (testes), including potential causes of infertility. Also, this test may be done to determine whether a previously performed vasectomy was successful.  Semen is a whitish secretion that is released from the penis during the final phase of orgasm (ejaculation) and near the end of intercourse or masturbation. It is made up of liquids and nutrients from the prostate gland, seminal vesicles, and other glands. It also contains sperm cells from the testes. A single sperm cell contains one complete set of a man's genetic coding (chromosome).  PREPARATION FOR TEST A semen sample will be collected in a sterile, glass container provided by the lab, or it may be collected in a condom that contains no lubricants or chemicals such as spermicides.  RESULTS  It is your responsibility to obtain your test results. Ask the lab or department performing the test when and how you will get your results. Contact your health care provider to discuss any questions you have about your results.  Range of Normal Values  Volume: 2-5 mL.  Liquefaction time: 20-30 minutes after collection.  Appearance: Normal.  Motile/mL: Greater than or equal to 10,000,000.  Sperm/mL: Greater than or equal to 20,000,000.  Viscosity: Greater than or equal to 3.  Agglutination: Greater than or equal to 3.  Supravital: Greater than or equal to 75% live.  Fructose: Positive.  pH: 7.12-8.00.  Sperm count (density): Greater than or equal to 20 million/mL.  Sperm motility: Greater than or equal to 50% at 1 hour.  Sperm morphology: Greater than 30% Tyrell Antonio criteria greater than 14%) normally shaped. Ranges for normal findings may vary among different laboratories and hospitals. You should always check with your doctor after having lab work or other tests done to discuss the meaning of your test results and whether your values are  considered within normal limits. Meaning of Test Results Low sperm count, abnormal movement (motility), or abnormal shape (morphology) of sperm cells may all cause problems with female fertility. Your health care provider will go over the test results with you and discuss the importance and meaning of your results, as well as treatment options and the need for additional tests if necessary.  Document Released: 04/06/2004 Document Revised: 07/27/2013 Document Reviewed: 02/22/2008 [facility] Patient Information 2015 [place], [place]. This information is not intended to replace advice given to you by your health care provider. Make sure you discuss any questions you have with your health care provider.

## 2013-11-20 NOTE — Progress Notes (Signed)
Patient ID: Marissa [name], female   DOB: [birth year], 38 y.o.   MRN: [id number]  Chief Complaint  Patient presents with  . Follow-up    Infertility     HPI Marissa [name] is [name] 38 y.o. female.  Reports ovulation predictor kits consistent with ovulatory cycles.  Reports normal AMH, fasting glucose/insulin testing by her primary care provider.  HPI  Past Medical History  Diagnosis Date  . Hypertension     Past Surgical History  Procedure Laterality Date  . Cesarean section    . Tonsillectomy      Family History  Problem Relation Age of Onset  . Hypertension Paternal Grandmother   . Stroke Paternal Grandmother   . Stroke Mother   . Hypertension Mother     Social History History  Substance Use Topics  . Smoking status: Never Smoker   . Smokeless tobacco: Never Used  . Alcohol Use: Yes     Comment: occasional    Allergies  Allergen Reactions  . Penicillins Hives    Current Outpatient Prescriptions  Medication Sig Dispense Refill  . hydrochlorothiazide (HYDRODIURIL) 25 MG tablet Take 25 mg by mouth daily.      . Omega-3 Fatty Acids (FISH OIL PO) Take 12 mg by mouth.      . Prenatal Vit-Fe Sulfate-FA (PRENATAL VITAMIN PO) Take by mouth.       No current facility-administered medications for this visit.    Review of Systems Review of Systems Constitutional: negative for fatigue and weight loss Respiratory: negative for cough and wheezing Cardiovascular: negative for chest pain, fatigue and palpitations Gastrointestinal: negative for abdominal pain and change in bowel habits Genitourinary:negative for abnormal vaginal discharge Integument/breast: negative for nipple discharge Musculoskeletal:negative for myalgias Neurological: negative for gait problems and tremors Behavioral/Psych: negative for abusive relationship, depression Endocrine: negative for temperature intolerance     Height 5' (1.524 m), weight 85.276 kg (188 lb), last menstrual period  11/07/2013, unknown if currently breastfeeding.  Physical Exam Physical Exam   50% of 15 min visit spent on counseling and coordination of care.   Data Reviewed Pelvic U/s  Assessment    Recent functional ovarian cyst resolved Attempting to conceive, AMA     Plan   Semen analysis Orders Placed This Encounter  Procedures  . DG Hysterogram (HSG)    NEEDING HYSTEROSALPINGOGRAM    Standing Status: Future     Number of Occurrences:      Standing Expiration Date: 01/20/2015    Order Specific Question:  Reason for Exam (SYMPTOM  OR DIAGNOSIS REQUIRED)    Answer:  iNFERTILITY    Order Specific Question:  Is the patient pregnant?    Answer:  No    Order Specific Question:  Preferred imaging location?    Answer:  [facility]  . Flu Vaccine QUAD 36+ mos IM (Fluarix)  . Ambulatory referral to Endocrinology    Referral Priority:  Routine    Referral Type:  Consultation    Referral Reason:  Specialty Services Required    Requested Specialty:  Reproductive Endocrinology and Infertility    Number of Visits Requested:  1   Continued efforts to lose weight  Need to obtain previous records Follow up as needed.         Marissa [name] 11/20/2013, 9:54 PM

## 2013-11-23 ENCOUNTER — Telehealth: Payer: Self-pay

## 2013-11-23 NOTE — Telephone Encounter (Signed)
Dr. Charlett Lango office called - patient has appt on 12/23/13 at 3:30 - was told patient was aware of appt date and time

## 2013-12-07 ENCOUNTER — Telehealth: Payer: Self-pay | Admitting: *Deleted

## 2013-12-07 NOTE — Telephone Encounter (Signed)
Patient called stating she needs labs drawn cycle day 3.  CB: 6:08pm, Patient states her cycle day 3 was over the weekend. Patient states she is now on cycle day 4 and tomorrow will be cycle day 5. Patient states she was wanting to get her labs drawn at her work but is afraid that tomorrow will be too late. Patient notified that per Dr. Delsa Sale its ok for her to have her labs drawn tomorrow. Fax sent to patients work with Rx for Lab orders.

## 2014-01-14 ENCOUNTER — Ambulatory Visit: Payer: BC Managed Care – PPO | Admitting: Obstetrics & Gynecology

## 2014-01-25 ENCOUNTER — Encounter: Payer: Self-pay | Admitting: Obstetrics & Gynecology

## 2014-03-22 ENCOUNTER — Encounter: Payer: Self-pay | Admitting: *Deleted

## 2014-03-23 ENCOUNTER — Encounter: Payer: Self-pay | Admitting: Obstetrics & Gynecology

## 2014-04-09 ENCOUNTER — Encounter (HOSPITAL_BASED_OUTPATIENT_CLINIC_OR_DEPARTMENT_OTHER): Payer: Self-pay | Admitting: *Deleted

## 2014-04-09 NOTE — Progress Notes (Signed)
NPO AFTER MN WITH EXCEPTION CLEAR LIQUIDS UNTIL 0800 (NO CREAM/ MILK PRODUCTS).  ARRIVE AT 1230. NEEDS ISTAT AND EKG.

## 2014-04-13 NOTE — Anesthesia Preprocedure Evaluation (Signed)
Anesthesia Evaluation  Patient identified by MRN, date of birth, ID band Patient awake    Reviewed: Allergy & Precautions, NPO status , Patient's Chart, lab work & pertinent test results  Airway Mallampati: II  TM Distance: >3 FB Neck ROM: Full    Dental no notable dental hx. (+) Dental Advisory Given   Pulmonary neg pulmonary ROS,  breath sounds clear to auscultation  Pulmonary exam normal       Cardiovascular hypertension, Pt. on medications Rhythm:Regular Rate:Normal     Neuro/Psych negative neurological ROS  negative psych ROS   GI/Hepatic negative GI ROS, Neg liver ROS,   Endo/Other  negative endocrine ROS  Renal/GU negative Renal ROS  negative genitourinary   Musculoskeletal negative musculoskeletal ROS (+)   Abdominal   Peds negative pediatric ROS (+)  Hematology negative hematology ROS (+)   Anesthesia Other Findings   Reproductive/Obstetrics negative OB ROS                             Anesthesia Physical Anesthesia Plan  ASA: II  Anesthesia Plan: General   Post-op Pain Management:    Induction: Intravenous  Airway Management Planned: Oral ETT  Additional Equipment:   Intra-op Plan:   Post-operative Plan: Extubation in OR  Informed Consent: I have reviewed the patients History and Physical, chart, labs and discussed the procedure including the risks, benefits and alternatives for the proposed anesthesia with the patient or authorized representative who has indicated his/her understanding and acceptance.   Dental advisory given  Plan Discussed with: CRNA  Anesthesia Plan Comments:         Anesthesia Quick Evaluation

## 2014-04-14 ENCOUNTER — Encounter (HOSPITAL_BASED_OUTPATIENT_CLINIC_OR_DEPARTMENT_OTHER)
Admission: RE | Disposition: A | Discharge status: Home / Self Care | Payer: BLUE CROSS/BLUE SHIELD | Source: Ambulatory Visit | Attending: Obstetrics and Gynecology

## 2014-04-14 ENCOUNTER — Ambulatory Visit (HOSPITAL_BASED_OUTPATIENT_CLINIC_OR_DEPARTMENT_OTHER)
Admission: RE | Admit: 2014-04-14 | Discharge: 2014-04-14 | Disposition: A | Discharge status: Home / Self Care | Payer: BLUE CROSS/BLUE SHIELD | Source: Ambulatory Visit | Attending: Obstetrics and Gynecology | Admitting: Obstetrics and Gynecology

## 2014-04-14 ENCOUNTER — Encounter (HOSPITAL_BASED_OUTPATIENT_CLINIC_OR_DEPARTMENT_OTHER): Payer: Self-pay | Admitting: *Deleted

## 2014-04-14 ENCOUNTER — Ambulatory Visit (HOSPITAL_BASED_OUTPATIENT_CLINIC_OR_DEPARTMENT_OTHER): Payer: BLUE CROSS/BLUE SHIELD | Admitting: Anesthesiology

## 2014-04-14 ENCOUNTER — Other Ambulatory Visit: Payer: Self-pay

## 2014-04-14 DIAGNOSIS — N979 Female infertility, unspecified: Secondary | ICD-10-CM | POA: Insufficient documentation

## 2014-04-14 DIAGNOSIS — N803 Endometriosis of pelvic peritoneum: Secondary | ICD-10-CM | POA: Diagnosis not present

## 2014-04-14 DIAGNOSIS — I10 Essential (primary) hypertension: Secondary | ICD-10-CM | POA: Diagnosis not present

## 2014-04-14 DIAGNOSIS — Z8249 Family history of ischemic heart disease and other diseases of the circulatory system: Secondary | ICD-10-CM | POA: Insufficient documentation

## 2014-04-14 DIAGNOSIS — D250 Submucous leiomyoma of uterus: Secondary | ICD-10-CM | POA: Diagnosis not present

## 2014-04-14 DIAGNOSIS — R102 Pelvic and perineal pain: Secondary | ICD-10-CM | POA: Diagnosis present

## 2014-04-14 HISTORY — PX: HYSTEROSCOPY: SHX211

## 2014-04-14 HISTORY — DX: Polyp of corpus uteri: N84.0

## 2014-04-14 HISTORY — DX: Endometriosis, unspecified: N80.9

## 2014-04-14 HISTORY — PX: LAPAROSCOPY: SHX197

## 2014-04-14 LAB — POCT PREGNANCY, URINE: PREG TEST UR: NEGATIVE

## 2014-04-14 LAB — POCT I-STAT 4, (NA,K, GLUC, HGB,HCT)
GLUCOSE: 81 mg/dL (ref 70–99)
HEMATOCRIT: 46 % (ref 36.0–46.0)
Hemoglobin: 15.6 g/dL — ABNORMAL HIGH (ref 12.0–15.0)
Potassium: 4.3 mmol/L (ref 3.5–5.1)
SODIUM: 140 mmol/L (ref 135–145)

## 2014-04-14 SURGERY — HYSTEROSCOPY
Anesthesia: General | Site: Vagina

## 2014-04-14 MED ORDER — GLYCOPYRROLATE 0.2 MG/ML IJ SOLN
INTRAMUSCULAR | Status: DC | PRN
  Administered 2014-04-14: 0.4 mg via INTRAVENOUS

## 2014-04-14 MED ORDER — KETOROLAC TROMETHAMINE 30 MG/ML IJ SOLN
INTRAMUSCULAR | Status: DC | PRN
  Administered 2014-04-14: 30 mg via INTRAVENOUS

## 2014-04-14 MED ORDER — DEXAMETHASONE SODIUM PHOSPHATE 4 MG/ML IJ SOLN
INTRAMUSCULAR | Status: DC | PRN
  Administered 2014-04-14: 10 mg via INTRAVENOUS

## 2014-04-14 MED ORDER — ROCURONIUM BROMIDE 100 MG/10ML IV SOLN
INTRAVENOUS | Status: DC | PRN
  Administered 2014-04-14: 10 mg via INTRAVENOUS
  Administered 2014-04-14: 40 mg via INTRAVENOUS

## 2014-04-14 MED ORDER — MIDAZOLAM HCL 5 MG/5ML IJ SOLN
INTRAMUSCULAR | Status: DC | PRN
  Administered 2014-04-14 (×2): 1 mg via INTRAVENOUS

## 2014-04-14 MED ORDER — OXYCODONE-ACETAMINOPHEN 7.5-325 MG PO TABS: 1.0000 | ORAL_TABLET | ORAL | Status: DC | PRN

## 2014-04-14 MED ORDER — OXYCODONE HCL 5 MG PO TABS
5.0000 mg | ORAL_TABLET | Freq: Once | ORAL | Status: AC
  Administered 2014-04-14: 5 mg via ORAL
  Filled 2014-04-14: qty 1

## 2014-04-14 MED ORDER — OXYCODONE HCL 5 MG PO TABS
ORAL_TABLET | ORAL | Status: AC
  Filled 2014-04-14: qty 1

## 2014-04-14 MED ORDER — NEOSTIGMINE METHYLSULFATE 10 MG/10ML IV SOLN
INTRAVENOUS | Status: DC | PRN
  Administered 2014-04-14: 3 mg via INTRAVENOUS

## 2014-04-14 MED ORDER — FENTANYL CITRATE 0.05 MG/ML IJ SOLN
25.0000 ug | INTRAMUSCULAR | Status: DC | PRN
  Filled 2014-04-14: qty 1

## 2014-04-14 MED ORDER — LIDOCAINE HCL (CARDIAC) 20 MG/ML IV SOLN
INTRAVENOUS | Status: DC | PRN
  Administered 2014-04-14: 100 mg via INTRAVENOUS

## 2014-04-14 MED ORDER — ONDANSETRON HCL 4 MG/2ML IJ SOLN
INTRAMUSCULAR | Status: DC | PRN
Start: 2014-04-14 — End: 2014-04-14
  Administered 2014-04-14: 4 mg via INTRAVENOUS

## 2014-04-14 MED ORDER — ONDANSETRON HCL 4 MG/2ML IJ SOLN
4.0000 mg | Freq: Once | INTRAMUSCULAR | Status: AC | PRN
  Administered 2014-04-14: 4 mg via INTRAVENOUS
  Filled 2014-04-14: qty 2

## 2014-04-14 MED ORDER — BUPIVACAINE-EPINEPHRINE 0.25% -1:200000 IJ SOLN
INTRAMUSCULAR | Status: DC | PRN
  Administered 2014-04-14: 8 mL

## 2014-04-14 MED ORDER — CEFAZOLIN SODIUM-DEXTROSE 2-3 GM-% IV SOLR
INTRAVENOUS | Status: DC | PRN
  Administered 2014-04-14: 2 g via INTRAVENOUS

## 2014-04-14 MED ORDER — VASOPRESSIN 20 UNIT/ML IV SOLN
INTRAVENOUS | Status: DC | PRN
  Administered 2014-04-14: 8 mL via INTRAMUSCULAR

## 2014-04-14 MED ORDER — MIDAZOLAM HCL 2 MG/2ML IJ SOLN
INTRAMUSCULAR | Status: AC
  Filled 2014-04-14: qty 2

## 2014-04-14 MED ORDER — SODIUM CHLORIDE 0.9 % IR SOLN
Status: DC | PRN
  Administered 2014-04-14: 3500 mL

## 2014-04-14 MED ORDER — LACTATED RINGERS IV SOLN
INTRAVENOUS | Status: DC | PRN
  Administered 2014-04-14 (×4): via INTRAVENOUS

## 2014-04-14 MED ORDER — FENTANYL CITRATE 0.05 MG/ML IJ SOLN
INTRAMUSCULAR | Status: AC
  Filled 2014-04-14: qty 6

## 2014-04-14 MED ORDER — ONDANSETRON HCL 4 MG PO TABS: 4.0000 mg | ORAL_TABLET | Freq: Three times a day (TID) | ORAL | Status: DC | PRN

## 2014-04-14 MED ORDER — FENTANYL CITRATE 0.05 MG/ML IJ SOLN
INTRAMUSCULAR | Status: DC | PRN
  Administered 2014-04-14: 50 ug via INTRAVENOUS
  Administered 2014-04-14 (×4): 25 ug via INTRAVENOUS
  Administered 2014-04-14: 50 ug via INTRAVENOUS
  Administered 2014-04-14 (×4): 25 ug via INTRAVENOUS

## 2014-04-14 MED ORDER — ACETAMINOPHEN 10 MG/ML IV SOLN
INTRAVENOUS | Status: DC | PRN
  Administered 2014-04-14: 1000 mg via INTRAVENOUS

## 2014-04-14 MED ORDER — PROPOFOL 10 MG/ML IV BOLUS
INTRAVENOUS | Status: DC | PRN
  Administered 2014-04-14: 200 mg via INTRAVENOUS

## 2014-04-14 MED ORDER — LACTATED RINGERS IV SOLN
INTRAVENOUS | Status: DC
  Administered 2014-04-14: 13:00:00 via INTRAVENOUS
  Filled 2014-04-14: qty 1000

## 2014-04-14 MED ORDER — GLYCINE 1.5 % IR SOLN
Status: DC | PRN
  Administered 2014-04-14: 3000 mL

## 2014-04-14 MED ORDER — ONDANSETRON HCL 4 MG/2ML IJ SOLN
INTRAMUSCULAR | Status: AC
  Filled 2014-04-14: qty 2

## 2014-04-14 MED ORDER — METHYLENE BLUE 1 % INJ SOLN
INTRAMUSCULAR | Status: DC | PRN
  Administered 2014-04-14: 1 mL via SUBMUCOSAL

## 2014-04-14 MED ORDER — LACTATED RINGERS IR SOLN
Status: DC | PRN
  Administered 2014-04-14: 3000 mL

## 2014-04-14 MED ORDER — FENTANYL CITRATE 0.05 MG/ML IJ SOLN
INTRAMUSCULAR | Status: AC
  Filled 2014-04-14: qty 2

## 2014-04-14 SURGICAL SUPPLY — 54 items
BAG URINE DRAINAGE (UROLOGICAL SUPPLIES) ×5 IMPLANT
BLADE SURG 11 STRL SS (BLADE) ×5 IMPLANT
CANISTER SUCTION 2500CC (MISCELLANEOUS) ×5 IMPLANT
CANNULA CURETTE W/SYR 6 (CANNULA) ×4 IMPLANT
CANNULA CURETTE W/SYR 6MM (CANNULA) ×1
CATH FOLEY 2WAY SLVR  5CC 14FR (CATHETERS) ×2
CATH FOLEY 2WAY SLVR 5CC 14FR (CATHETERS) ×3 IMPLANT
CATH ROBINSON RED A/P 16FR (CATHETERS) IMPLANT
COVER MAYO STAND STRL (DRAPES) ×5 IMPLANT
COVER TABLE BACK 60X90 (DRAPES) ×5 IMPLANT
DRAPE CAMERA CLOSED 9X96 (DRAPES) ×5 IMPLANT
DRAPE UNDERBUTTOCKS STRL (DRAPE) ×5 IMPLANT
DRSG OPSITE POSTOP 3X4 (GAUZE/BANDAGES/DRESSINGS) IMPLANT
DRSG TELFA 3X8 NADH (GAUZE/BANDAGES/DRESSINGS) ×5 IMPLANT
ELECT REM PT RETURN 9FT ADLT (ELECTROSURGICAL) ×5
ELECTRODE REM PT RTRN 9FT ADLT (ELECTROSURGICAL) ×3 IMPLANT
GLOVE BIO SURGEON STRL SZ8 (GLOVE) ×5 IMPLANT
GLOVE BIOGEL PI IND STRL 8.5 (GLOVE) ×6 IMPLANT
GLOVE BIOGEL PI INDICATOR 8.5 (GLOVE) ×4
GOWN STRL REUS W/ TWL XL LVL3 (GOWN DISPOSABLE) ×6 IMPLANT
GOWN STRL REUS W/TWL XL LVL3 (GOWN DISPOSABLE) ×4
IV LACTATED RINGER IRRG 3000ML (IV SOLUTION) ×2
IV LR IRRIG 3000ML ARTHROMATIC (IV SOLUTION) ×3 IMPLANT
IV NS IRRIG 3000ML ARTHROMATIC (IV SOLUTION) ×5 IMPLANT
LEGGING LITHOTOMY PAIR STRL (DRAPES) ×5 IMPLANT
LIQUID BAND (GAUZE/BANDAGES/DRESSINGS) ×5 IMPLANT
LOOP ANGLED CUTTING 22FR (CUTTING LOOP) ×5 IMPLANT
MANIPULATOR UTERINE 4.5 ZUMI (MISCELLANEOUS) ×5 IMPLANT
NEEDLE HYPO 25X1 1.5 SAFETY (NEEDLE) ×5 IMPLANT
NEEDLE INSUFFLATION 14GA 120MM (NEEDLE) ×5 IMPLANT
NEEDLE SPNL 22GX3.5 QUINCKE BK (NEEDLE) ×5 IMPLANT
NS IRRIG 500ML POUR BTL (IV SOLUTION) ×5 IMPLANT
PACK BASIN DAY SURGERY FS (CUSTOM PROCEDURE TRAY) ×5 IMPLANT
PACK LAPAROSCOPY II (CUSTOM PROCEDURE TRAY) ×5 IMPLANT
PAD OB MATERNITY 4.3X12.25 (PERSONAL CARE ITEMS) ×5 IMPLANT
SEPRAFILM MEMBRANE 5X6 (MISCELLANEOUS) IMPLANT
SET BERKELEY SUCTION TUBING (SUCTIONS) ×10 IMPLANT
SET IRRIG TUBING LAPAROSCOPIC (IRRIGATION / IRRIGATOR) ×5 IMPLANT
SET IRRIG Y TYPE TUR BLADDER L (SET/KITS/TRAYS/PACK) ×5 IMPLANT
SOLUTION ANTI FOG 6CC (MISCELLANEOUS) ×5 IMPLANT
SUT MNCRL AB 4-0 PS2 18 (SUTURE) ×5 IMPLANT
SYR 20CC LL (SYRINGE) IMPLANT
SYR 30ML LL (SYRINGE) ×5 IMPLANT
SYR 3ML 18GX1 1/2 (SYRINGE) ×5 IMPLANT
SYR 3ML 23GX1 SAFETY (SYRINGE) ×5 IMPLANT
SYR 5ML LL (SYRINGE) ×5 IMPLANT
SYR CONTROL 10ML LL (SYRINGE) ×5 IMPLANT
SYRINGE 12CC LL (MISCELLANEOUS) ×5 IMPLANT
TOWEL OR 17X24 6PK STRL BLUE (TOWEL DISPOSABLE) ×10 IMPLANT
TRAY DSU PREP LF (CUSTOM PROCEDURE TRAY) ×5 IMPLANT
TROCAR OPTI TIP 5M 100M (ENDOMECHANICALS) ×15 IMPLANT
TROCAR XCEL DIL TIP R 11M (ENDOMECHANICALS) ×5 IMPLANT
TUBING INSUFFLATION 10FT LAP (TUBING) ×5 IMPLANT
WARMER LAPAROSCOPE (MISCELLANEOUS) ×5 IMPLANT

## 2014-04-14 NOTE — H&P (Addendum)
Marissa [name] is a 39 y.o. female , originally referred to me by Dr. Delsa Sale, for infertility. She has dysmenorrhea and infertility. Because of her previous C-section pelvic adhesions are also suspected. Patient would like to preserve her childbearing potential. By HSG she was diagnosed with a 15 x 11 mm left fundal filling defect in the uterine cavity.  Pertinent Gynecological History: Menses: normal Bleeding: normal Contraception: none DES exposure: denies Blood transfusions: none Sexually transmitted diseases: no past history Previous GYN Procedures: C-Section  Last mammogram: normal Last pap: normal  OB History: G4 P3 SAB 1   Menstrual History: Menarche age: 74 No LMP recorded.    Past Medical History  Diagnosis Date  . Hypertension   . Endometrial polyp   . Endometriosis                     Past Surgical History  Procedure Laterality Date  . Cesarean section  1995  . Tonsillectomy  12-21-2003    12-27-2003--  CAUTERIZATION RIGHT TONSILLAR BLEED POST-OP             Family History  Problem Relation Age of Onset  . Hypertension Paternal Grandmother   . Stroke Paternal Grandmother   . Stroke Mother   . Hypertension Mother    No hereditary disease.  No cancer of breast, ovary, uterus. No cutaneous leiomyomatosis or renal cell carcinoma.  History   Social History  . Marital Status: Married    Spouse Name: N/A    Number of Children: N/A  . Years of Education: N/A   Occupational History  . Not on file.   Social History Main Topics  . Smoking status: Never Smoker   . Smokeless tobacco: Never Used  . Alcohol Use: No  . Drug Use: No  . Sexual Activity:    Partners: Male    Birth Control/ Protection: None   Other Topics Concern  . Not on file   Social History Narrative    Allergies  Allergen Reactions  . Penicillins Hives    No current facility-administered medications on file prior to encounter.   No current outpatient prescriptions  on file prior to encounter.     Review of Systems  Constitutional: Negative.   HENT: Negative.   Eyes: Negative.   Respiratory: Negative.   Cardiovascular: Negative.   Gastrointestinal: Negative.   Genitourinary: Negative.   Musculoskeletal: Negative.   Skin: Negative.   Neurological: Negative.   Endo/Heme/Allergies: Negative.   Psychiatric/Behavioral: Negative.      Physical Exam  Ht 5' (1.524 m)  Wt 81.647 kg (180 lb)  BMI 35.15 kg/m2  LMP 04/05/2014 (Exact Date)  Breastfeeding? No Constitutional: She is oriented to person, place, and time. She appears well-developed and well-nourished.  HENT:  Head: Normocephalic and atraumatic.  Nose: Nose normal.  Mouth/Throat: Oropharynx is clear and moist. No oropharyngeal exudate.  Eyes: Conjunctivae normal and EOM are normal. Pupils are equal, round, and reactive to light. No scleral icterus.  Neck: Normal range of motion. Neck supple. No tracheal deviation present. No thyromegaly present.  Cardiovascular: Normal rate.   Respiratory: Effort normal and breath sounds normal.  GI: Soft. Bowel sounds are normal. She exhibits no distension and no mass. There is no tenderness.  Lymphadenopathy:    She has no cervical adenopathy.  Neurological: She is alert and oriented to person, place, and time. She has normal reflexes.  Skin: Skin is warm.  Psychiatric: She has a normal mood and affect. Her behavior  is normal. Judgment and thought content normal.    Assessment/Plan:  Patient has suspected endometriosis as well as a 15 x 11 mm left fundal filling defect   We will perform a hysteroscopy, polypectomy, laparoscopy, lysis of adhesions, possible excision and ablation of endometriosis. I reviewed the benefits, risks of the planned procedures, as well as expected chances of improvement over her fertility following the procedure.  Governor Specking

## 2014-04-14 NOTE — OR Nursing (Signed)
400 ml deficit of glycine during surgical procedure

## 2014-04-14 NOTE — Op Note (Signed)
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 endotracheal  Complications: None  Estimated blood loss: 50 cc  Specimens: Left pelvic sidewall, right pelvic sidewall and anterior cul-de-sac biopsies to pathology (including excision of a right posterior cul-de-sac deep nodule)  Findings: Exam under anesthesia showed normal external genitalia, Bartholin's, Skene's, and urethra. The vagina was normal. Cervix appeared grossly normal. Uterus sounded to 8 cm. There was a 2 x 2 centimeter nodule palpable in the right posterior fornix, corresponding to the right uterosacral ligament. Rectal exam showed normal rectal mucosa and integrity of the rectal mucosa was checked throughout the electrosurgical excision of posterior cul-de-sac lesion.  On hysteroscopy, endocervical canal was normal the endometrium was of normal appearance, size and configuration. Both tubal ostia were seen. Posterior left endometrium was protuberant with 2 type II submucosal myomas: 0.5 cm x 0.5 cm immediately caudal to the left tubal ostium and a 1 x 1 cm myoma caudal to the previous one. Both were on the posterior wall. These were resected.  On laparoscopy, upper abdomen, liver surface and diaphragm surfaces were normal. . The appendix was visualized. The uterus appeared grossly normal except for seedling myomas. Anterior cul-de-sac showed brown lesions at the level of her C-section scar. There were brown lesions in left and right iliac fossae, the lesion on the left was excised the one on the right was ablated. There were numerous  lesions in the left ovarian fossa which were ablated and excised. There was a deep lesion at the left uterosacral ligament which was partially ablated. There was a larger right uterosacral ligament nodule which was a deep lesion and this was excised. There was some brown lesions on the right ovarian fossa the these were also ablated. Other more superficial brown lesions in the posterior cul-de-sac were ablated. The right tube appeared normal proximally and distally, but did not fill with methylene blue, this may have been a technical problem. The left tube was normal proximally and had a endometriotic lesions which was causing a contracture of the mid ampullary portion of the tube this lesion was ablated with tip of the electrosurgical needle carefully. The left tube did not fill with the methylene blue solution, either and again, we attributed the tubal nonfilling to a technical problem. Patient will get a postop HSG to reevaluate the proximal tubes Both ovaries appeared normal.  Description of the procedure: The patient was placed in dorsal supine position and general endotracheal anesthesia was given. 2 g of cefazolin were given intravenously for prophylaxis. Patient was placed in lithotomy position. She was prepped and draped inside manner.  The bladder was straight catheterized.   A vaginal speculum was inserted and the cervix was grasped with a tenaculum. A dilute vasopressin solution, 0.4 units per mL, was injected into the cervix. A 12 hysteroscope was inserted into the endocervical canal and video hysteroscopy was started. The distention medium was initially saline and  the distention method was gravity. Above findings were noted. The cervix was dilated to 72 [place] with Hanks dilators. A Handy Vak device with a 6 mm curette was inserted after the uterus sounded to 8 cm. The endometrium covering the posterior protuberances was curetted by suction curettage. The above-mentioned type II submucosal  myomas were noted in the posterior wall. We then switched to a 1.5% glycine distention medium and used a 22 [place] cutting loop on a monopolar Slimline resectoscope to resect the 2 small submucosal myomas. We used a 82 W pure cutting current. In doing so, we took care to minimize endometrial damage in the posterior wall. The fibroid pieces were collected and sent to pathology. Hemostasis was insured inside the uterus. Hysteroscopy procedure was terminated.  A ZUMI catheter was placed into the uterine cavity. The surgeon was regloved and a surgical field was created on the abdomen.  After preemptive anesthesia of all surgical sites with 0.25% bupivacaine with 1:200,000 epinephrine, a 5 mm intraumbilical skin incision was made and a Verress needle was inserted. Its correct location was confirmed. A pneumoperitoneum was created with carbon dioxide.  5 mm laparoscope with a 30 lens was inserted and video laparoscopy was started . A left lower quadrant 5 mm and a right lower quadrant  5 mm incisions (which was later enlarged to 10 mm in order to remove the right posterior cul-de-sac deep nodule) were made and ancillary trochars were placed under direct visualization. Above findings were noted.   Using a needle electrode on 61 W of cutting current, the anterior cul-de-sac lesions were excised. We used hydrodissection method to elevate the peritoneum containing the lesions that were to be excised or ablated in the pelvic sidewalls, in order to prevent vascular and ureteral injury. As listed above these lesions were excised and ablated. We particularly paid attention not to injure the rectal mucosa during removal of the right posterior deep nodule and did simultaneous rectal exam to protect the integrity of the rectal wall. An arteriolar bleeding was coagulated at the base of the biopsy.  The stellate lesion causing a contracture on the mid ampullary portion of the left tube was ablated and the tube was allowed to  reform its shape.  Hemostasis was insured. The instrument and lap pad count were correct. Pelvis was copiously irrigated and aspirated. Repeat chromotubation did not show any tubal filling and we will follow this up with a hysterosalpingogram.   The instruments were removed and the gas was allowed to escape. A 4-0 Monocryl subcuticular stitch was placed on the 10 mm trocar site. The rest of the 5 mm incisions were approximated with Dermabond on the skin. The patient tolerated the procedure well and was transferred to recovery room in satisfactory condition.  Governor Specking

## 2014-04-14 NOTE — Anesthesia Postprocedure Evaluation (Signed)
  Anesthesia Post-op Note  Patient: Marissa [name]  Procedure(s) Performed: Procedure(s): HYSTEROSCOPY, SUCTION D&C, RESECTION OF MYOMA (N/A) LAPAROSCOPY, EXCISION AND ABLATION OF ENDOMETRIOSIS, CHROMOTUBATION (N/A)  Patient Location: PACU  Anesthesia Type:General  Level of Consciousness: awake and alert   Airway and Oxygen Therapy: Patient Spontanous Breathing  Post-op Pain: none  Post-op Assessment: Post-op Vital signs reviewed, Patient's Cardiovascular Status Stable and Respiratory Function Stable  Post-op Vital Signs: Reviewed  Filed Vitals:   04/14/14 1937  BP: 119/59  Pulse: 64  Temp: 36.4 C  Resp: 18    Complications: No apparent anesthesia complications

## 2014-04-14 NOTE — Anesthesia Procedure Notes (Addendum)
Procedure Name: Intubation Date/Time: 04/14/2014 2:50 PM Performed by: Justice Rocher Pre-anesthesia Checklist: Patient identified, Emergency Drugs available, Suction available and Patient being monitored Patient Re-evaluated:Patient Re-evaluated prior to inductionOxygen Delivery Method: Circle System Utilized Preoxygenation: Pre-oxygenation with 100% oxygen Intubation Type: IV induction Ventilation: Mask ventilation without difficulty Laryngoscope Size: Mac and 3 Grade View: Grade II Tube type: Oral Tube size: 7.0 mm Number of attempts: 1 Airway Equipment and Method: Stylet and Oral airway Placement Confirmation: ETT inserted through vocal cords under direct vision,  positive ETCO2 and breath sounds checked- equal and bilateral Secured at: 22 cm Tube secured with: Tape Dental Injury: Teeth and Oropharynx as per pre-operative assessment  Comments: Noted in pre-op - large fever open blister on top lip prior to induction

## 2014-04-14 NOTE — Transfer of Care (Signed)
Immediate Anesthesia Transfer of Care Note  Patient: Marissa [name]  Procedure(s) Performed: Procedure(s) (LRB): HYSTEROSCOPY, SUCTION D&C, RESECTION OF MYOMA (N/A) LAPAROSCOPY, EXCISION AND ABLATION OF ENDOMETRIOSIS, CHROMOTUBATION (N/A)  Patient Location: PACU  Anesthesia Type: General  Level of Consciousness: awake, sedated, patient cooperative and responds to stimulation  Airway & Oxygen Therapy: Patient Spontanous Breathing and Patient connected to face mask oxygen  Post-op Assessment: Report given to PACU RN, Post -op Vital signs reviewed and stable and Patient moving all extremities  Post vital signs: Reviewed and stable  Complications: No apparent anesthesia complications

## 2014-04-14 NOTE — Discharge Instructions (Signed)
° °  D & C Home care Instructions:   Personal hygiene:  Used sanitary napkins for vaginal drainage not tampons. Shower or tub bathe the day after your procedure. No douching until bleeding stops. Always wipe from front to back after  Elimination.  Activity: Do not drive or operate any equipment today. The effects of the anesthesia are still present and drowsiness may result. Rest today, not necessarily flat bed rest, just take it easy. You may resume your normal activity in one to 2 days.  Sexual activity: No intercourse for one week or as indicated by your physician  Diet: Eat a light diet as desired this evening. You may resume a regular diet tomorrow.  Return to work: One to 2 days.  General Expectations of your surgery: Vaginal bleeding should be no heavier than a normal period. Spotting may continue up to 10 days. Mild cramps may continue for a couple of days. You may have a regular period in 2-6 weeks.  Unexpected observations call your doctor if these occur: persistent or heavy bleeding. Severe abdominal cramping or pain. Elevation of temperature greater than 100F.  Call for an appointment in one week.    Patient's Signature_______________________________________________________  Nurse's Signature________________________________________________________  Dennis Bast were given Toradol (NSAID) at 5 pm, so DO NOT take any  Motrin, ibuprofen, advil or aleve until 11 pm tonight.   Post Anesthesia Home Care Instructions  Activity: Get plenty of rest for the remainder of the day. A responsible adult should stay with you for 24 hours following the procedure.  For the next 24 hours, DO NOT: -Drive a car -Paediatric nurse -Drink alcoholic beverages -Take any medication unless instructed by your physician -Make any legal decisions or sign important papers.  Meals: Start with liquid foods such as gelatin or soup. Progress to regular foods as tolerated. Avoid greasy, spicy, heavy foods. If  nausea and/or vomiting occur, drink only clear liquids until the nausea and/or vomiting subsides. Call your physician if vomiting continues.  Special Instructions/Symptoms: Your throat may feel dry or sore from the anesthesia or the breathing tube placed in your throat during surgery. If this causes discomfort, gargle with warm salt water. The discomfort should disappear within 24 hours.

## 2014-04-15 ENCOUNTER — Encounter (HOSPITAL_BASED_OUTPATIENT_CLINIC_OR_DEPARTMENT_OTHER): Payer: Self-pay | Admitting: Obstetrics and Gynecology

## 2014-05-27 ENCOUNTER — Ambulatory Visit: Payer: BC Managed Care – PPO | Admitting: Obstetrics & Gynecology

## 2016-02-20 ENCOUNTER — Encounter (HOSPITAL_COMMUNITY): Payer: Self-pay | Admitting: *Deleted

## 2016-02-20 ENCOUNTER — Inpatient Hospital Stay (HOSPITAL_COMMUNITY)
Admission: AD | Admit: 2016-02-20 | Discharge: 2016-02-20 | Disposition: A | Discharge status: Home / Self Care | Payer: Self-pay | Source: Ambulatory Visit | Attending: Obstetrics and Gynecology | Admitting: Obstetrics and Gynecology

## 2016-02-20 ENCOUNTER — Inpatient Hospital Stay (HOSPITAL_COMMUNITY): Payer: Self-pay

## 2016-02-20 DIAGNOSIS — O26891 Other specified pregnancy related conditions, first trimester: Secondary | ICD-10-CM

## 2016-02-20 DIAGNOSIS — Z3201 Encounter for pregnancy test, result positive: Secondary | ICD-10-CM | POA: Insufficient documentation

## 2016-02-20 DIAGNOSIS — R102 Pelvic and perineal pain: Secondary | ICD-10-CM

## 2016-02-20 DIAGNOSIS — O162 Unspecified maternal hypertension, second trimester: Secondary | ICD-10-CM | POA: Insufficient documentation

## 2016-02-20 DIAGNOSIS — N840 Polyp of corpus uteri: Secondary | ICD-10-CM | POA: Insufficient documentation

## 2016-02-20 DIAGNOSIS — O200 Threatened abortion: Secondary | ICD-10-CM | POA: Insufficient documentation

## 2016-02-20 DIAGNOSIS — Z880 Allergy status to penicillin: Secondary | ICD-10-CM | POA: Insufficient documentation

## 2016-02-20 DIAGNOSIS — Z3A01 Less than 8 weeks gestation of pregnancy: Secondary | ICD-10-CM | POA: Insufficient documentation

## 2016-02-20 LAB — URINALYSIS, ROUTINE W REFLEX MICROSCOPIC
BILIRUBIN URINE: NEGATIVE
Glucose, UA: NEGATIVE mg/dL
Ketones, ur: NEGATIVE mg/dL
Leukocytes, UA: NEGATIVE
Nitrite: NEGATIVE
PH: 6 (ref 5.0–8.0)
Protein, ur: NEGATIVE mg/dL

## 2016-02-20 LAB — URINE MICROSCOPIC-ADD ON: BACTERIA UA: NONE SEEN

## 2016-02-20 LAB — POCT PREGNANCY, URINE: PREG TEST UR: POSITIVE — AB

## 2016-02-20 LAB — HCG, QUANTITATIVE, PREGNANCY: hCG, Beta Chain, Quant, S: 1990 m[IU]/mL — ABNORMAL HIGH (ref <5)

## 2016-02-20 NOTE — MAU Provider Note (Signed)
History     Chief Complaint  Patient presents with  . Vaginal Bleeding   40 yo [id number] BF LMP 10/25 (+) hCG 213 in own office( 11/22) presents with c/o vaginal spotting  Which started today and cramping. Hx SAB. (+) infertility. Had typhoid vaccine for upcoming [place] trip  OB History    Gravida Para Term Preterm AB Living   5 3 3   1 3    SAB TAB Ectopic Multiple Live Births   1       3      Past Medical History:  Diagnosis Date  . Endometrial polyp   . Endometriosis   . Hypertension     Past Surgical History:  Procedure Laterality Date  . CESAREAN SECTION  1995  . HYSTEROSCOPY N/A 04/14/2014   Procedure: HYSTEROSCOPY, SUCTION D&C, RESECTION OF MYOMA;  Surgeon: Governor Specking, MD;  Location: [place];  Service: Gynecology;  Laterality: N/A;  . LAPAROSCOPY N/A 04/14/2014   Procedure: LAPAROSCOPY, EXCISION AND ABLATION OF ENDOMETRIOSIS, CHROMOTUBATION;  Surgeon: Governor Specking, MD;  Location: [place];  Service: Gynecology;  Laterality: N/A;  . TONSILLECTOMY  12-21-2003   12-27-2003--  CAUTERIZATION RIGHT TONSILLAR BLEED POST-OP    Family History  Problem Relation Age of Onset  . Hypertension Paternal Grandmother   . Stroke Paternal Grandmother   . Stroke Mother   . Hypertension Mother     Social History  Substance Use Topics  . Smoking status: Never Smoker  . Smokeless tobacco: Never Used  . Alcohol use No    Allergies:  Allergies  Allergen Reactions  . Penicillins Hives    Has patient had a PCN reaction causing immediate rash, facial/tongue/throat swelling, SOB or lightheadedness with hypotension:  Has patient had a PCN reaction causing severe rash involving mucus membranes or skin necrosis:  Has patient had a PCN reaction that required hospitalization No Has patient had a PCN reaction occurring within the last 10 years: No If all of the above answers are "NO", then may proceed with Cephalosporin use. Unknown reaction,  from childhood.    Prescriptions Prior to Admission  Medication Sig Dispense Refill Last Dose  . Prenatal Vit-Fe Fumarate-FA (PRENATAL MULTIVITAMIN) TABS tablet Take 1 tablet by mouth daily at 12 noon.   02/20/2016 at Unknown time  . ondansetron (ZOFRAN) 4 MG tablet Take 1 tablet (4 mg total) by mouth every 8 (eight) hours as needed for nausea or vomiting. (Patient not taking: Reported on 02/20/2016) 12 tablet 0 Not Taking at Unknown time  . oxyCODONE-acetaminophen (PERCOCET) 7.5-325 MG per tablet Take 1 tablet by mouth every 4 (four) hours as needed for pain. (Patient not taking: Reported on 02/20/2016) 20 tablet 0 Not Taking at Unknown time     Physical Exam   Blood pressure 141/93, pulse 85, temperature 98.5 F (36.9 C), resp. rate 18, height 5\' 3"  (1.6 m), weight 92.5 kg (204 lb), last menstrual period 01/18/2016.  General appearance: alert, cooperative and no distress Abdomen: soft, non-tender; bowel sounds normal; no masses,  no organomegaly Pelvic: cervix normal in appearance, external genitalia normal, no adnexal masses or tenderness, uterus normal size, shape, and consistency and scant pink blood in vault. cervix closed Extremities: no edema, redness or tenderness in the calves or thighs   IMP: 1st trim vaginal bleeding( threatened AB) P) hquant. Pelvic sonogram. Blood type B positive ED Course   MDM Addendum:  US Ob Comp Less 14 Wks  Result Date: 02/20/2016 CLINICAL DATA:  Spotting  and cramping for 1 day. Quantitative beta HCG today is 1,990. LMP was10/25/2017. Gestational age by LMP is4 weeks 5 days. EDC by LMP is08/03/2016. EXAM: OBSTETRIC <14 WK [place] AND TRANSVAGINAL OB US TECHNIQUE: Both transabdominal and transvaginal ultrasound examinations were performed for complete evaluation of the gestation as well as the maternal uterus, adnexal regions, and pelvic cul-de-sac. Transvaginal technique was performed to assess early pregnancy. COMPARISON:  None applicable FINDINGS:  Intrauterine gestational sac: Present Yolk sac:  Not seen Embryo:  Not seen Cardiac Activity: Not seen MSD: 4.7  mm   5 w   1  d Subchorionic hemorrhage:  None visualized. Maternal uterus/adnexae: Ovaries have a normal appearance. Small left anterior myometrial fibroid is 1.5 x 1.3 x 1.3 cm. No free pelvic fluid identified. IMPRESSION: 1. Probable early intrauterine gestational sac, but no yolk sac, fetal pole, or cardiac activity yet visualized. Recommend follow-up quantitative B-HCG levels and follow-up US in 14 days to assess viability. This recommendation follows SRU consensus guidelines: Diagnostic Criteria for Nonviable Pregnancy Early in the First Trimester. [place] Med [id number]. 2. Normal appearance of both ovaries ; no adnexal mass. Electronically Signed   By: Nolon Nations M.D.   On: 02/20/2016 17:48   US Ob Transvaginal  Result Date: 02/20/2016 CLINICAL DATA:  Spotting and cramping for 1 day. Quantitative beta HCG today is 1,990. LMP was10/25/2017. Gestational age by LMP is4 weeks 5 days. EDC by LMP is08/03/2016. EXAM: OBSTETRIC <14 WK [place] AND TRANSVAGINAL OB US TECHNIQUE: Both transabdominal and transvaginal ultrasound examinations were performed for complete evaluation of the gestation as well as the maternal uterus, adnexal regions, and pelvic cul-de-sac. Transvaginal technique was performed to assess early pregnancy. COMPARISON:  None applicable FINDINGS: Intrauterine gestational sac: Present Yolk sac:  Not seen Embryo:  Not seen Cardiac Activity: Not seen MSD: 4.7  mm   5 w   1  d Subchorionic hemorrhage:  None visualized. Maternal uterus/adnexae: Ovaries have a normal appearance. Small left anterior myometrial fibroid is 1.5 x 1.3 x 1.3 cm. No free pelvic fluid identified. IMPRESSION: 1. Probable early intrauterine gestational sac, but no yolk sac, fetal pole, or cardiac activity yet visualized. Recommend follow-up quantitative B-HCG levels and follow-up US in 14 days to assess  viability. This recommendation follows SRU consensus guidelines: Diagnostic Criteria for Nonviable Pregnancy Early in the First Trimester. [place] [employer] Med [id number]. 2. Normal appearance of both ovaries ; no adnexal mass. Electronically Signed   By: Nolon Nations M.D.   On: 02/20/2016 17:48   F/u appt sonogram 1 week Bladen Umar A, MD 5:10 PM 02/20/2016

## 2016-02-20 NOTE — MAU Note (Signed)
Pt presents to MAU with complaints of vaginal spotting that started this morning with abdominal cramping. + pregnancy test on Wednesday. Has had QUANT drawn and states it was 213 on November the 22nd. Last intercourse 2 days ago.

## 2016-02-20 NOTE — Discharge Instructions (Signed)
Threatened Miscarriage °A threatened miscarriage is when you have vaginal bleeding during your first 20 weeks of pregnancy but the pregnancy has not ended. Your doctor will do tests to make sure you are still pregnant. The cause of the bleeding may not be known. This condition does not mean your pregnancy will end. It does increase the risk of it ending (complete miscarriage). °Follow these instructions at home: °· Make sure you keep all your doctor visits for prenatal care. °· Get plenty of rest. °· Do not have sex or use tampons if you have vaginal bleeding. °· Do not douche. °· Do not smoke or use drugs. °· Do not drink alcohol. °· Avoid caffeine. °Contact a doctor if: °· You have light bleeding from your vagina. °· You have belly pain or cramping. °· You have a fever. °Get help right away if: °· You have heavy bleeding from your vagina. °· You have clots of blood coming from your vagina. °· You have bad pain or cramps in your low back or belly. °· You have fever, chills, and bad belly pain. °This information is not intended to replace advice given to you by your health care provider. Make sure you discuss any questions you have with your health care provider. °Document Released: 02/23/2008 Document Revised: 08/18/2015 Document Reviewed: 01/06/2013 °Elsevier Interactive Patient Education © 2017 [employer]. ° °

## 2016-02-20 NOTE — MAU Note (Signed)
Had Hep A and Typhoid shot about 2 weeks ago;

## 2016-03-30 LAB — OB RESULTS CONSOLE ABO/RH: RH TYPE: POSITIVE

## 2016-03-30 LAB — OB RESULTS CONSOLE HEPATITIS B SURFACE ANTIGEN: HEP B S AG: NEGATIVE

## 2016-03-30 LAB — OB RESULTS CONSOLE RUBELLA ANTIBODY, IGM: Rubella: IMMUNE

## 2016-03-30 LAB — OB RESULTS CONSOLE HIV ANTIBODY (ROUTINE TESTING): HIV: NONREACTIVE

## 2016-03-30 LAB — OB RESULTS CONSOLE ANTIBODY SCREEN: ANTIBODY SCREEN: NEGATIVE

## 2016-03-30 LAB — OB RESULTS CONSOLE RPR: RPR: NONREACTIVE

## 2016-04-04 LAB — OB RESULTS CONSOLE GC/CHLAMYDIA
Chlamydia: NEGATIVE
Gonorrhea: NEGATIVE

## 2016-04-30 DIAGNOSIS — R6889 Other general symptoms and signs: Secondary | ICD-10-CM | POA: Diagnosis not present

## 2016-06-01 ENCOUNTER — Telehealth: Payer: Self-pay

## 2016-06-01 NOTE — Telephone Encounter (Signed)
Spoke with Freda Munro at [facility], attempting to schedule asap consult.  RA's schedule is open on Monday- scheduled pt with RA on Monday at 9:00.  Freda Munro will make pt aware of date/time.  Nothing further needed.

## 2016-06-04 ENCOUNTER — Institutional Professional Consult (permissible substitution): Payer: BLUE CROSS/BLUE SHIELD | Admitting: Pulmonary Disease

## 2016-06-21 ENCOUNTER — Institutional Professional Consult (permissible substitution): Payer: BLUE CROSS/BLUE SHIELD | Admitting: Pulmonary Disease

## 2016-08-07 DIAGNOSIS — Z23 Encounter for immunization: Secondary | ICD-10-CM | POA: Diagnosis not present

## 2016-08-24 DIAGNOSIS — R39198 Other difficulties with micturition: Secondary | ICD-10-CM | POA: Diagnosis not present

## 2016-09-07 DIAGNOSIS — R39198 Other difficulties with micturition: Secondary | ICD-10-CM | POA: Diagnosis not present

## 2016-09-25 ENCOUNTER — Encounter (HOSPITAL_COMMUNITY): Payer: Self-pay

## 2016-09-25 ENCOUNTER — Inpatient Hospital Stay (HOSPITAL_COMMUNITY): Payer: 59 | Admitting: Anesthesiology

## 2016-09-25 ENCOUNTER — Inpatient Hospital Stay (HOSPITAL_COMMUNITY)
Admission: AD | Admit: 2016-09-25 | Discharge: 2016-09-28 | DRG: 765 | Disposition: A | Discharge status: Home / Self Care | Payer: 59 | Source: Ambulatory Visit | Attending: Obstetrics and Gynecology | Admitting: Obstetrics and Gynecology

## 2016-09-25 DIAGNOSIS — O99824 Streptococcus B carrier state complicating childbirth: Secondary | ICD-10-CM | POA: Diagnosis present

## 2016-09-25 DIAGNOSIS — Z880 Allergy status to penicillin: Secondary | ICD-10-CM | POA: Diagnosis not present

## 2016-09-25 DIAGNOSIS — Z3A36 36 weeks gestation of pregnancy: Secondary | ICD-10-CM | POA: Diagnosis not present

## 2016-09-25 DIAGNOSIS — Z98891 History of uterine scar from previous surgery: Secondary | ICD-10-CM | POA: Diagnosis present

## 2016-09-25 DIAGNOSIS — Z6841 Body Mass Index (BMI) 40.0 and over, adult: Secondary | ICD-10-CM | POA: Diagnosis not present

## 2016-09-25 DIAGNOSIS — O99214 Obesity complicating childbirth: Secondary | ICD-10-CM | POA: Diagnosis present

## 2016-09-25 DIAGNOSIS — O3663X0 Maternal care for excessive fetal growth, third trimester, not applicable or unspecified: Secondary | ICD-10-CM | POA: Diagnosis present

## 2016-09-25 DIAGNOSIS — O9081 Anemia of the puerperium: Secondary | ICD-10-CM | POA: Diagnosis not present

## 2016-09-25 DIAGNOSIS — O411230 Chorioamnionitis, third trimester, not applicable or unspecified: Secondary | ICD-10-CM | POA: Diagnosis present

## 2016-09-25 DIAGNOSIS — O34211 Maternal care for low transverse scar from previous cesarean delivery: Secondary | ICD-10-CM | POA: Diagnosis present

## 2016-09-25 DIAGNOSIS — D62 Acute posthemorrhagic anemia: Secondary | ICD-10-CM | POA: Diagnosis not present

## 2016-09-25 DIAGNOSIS — O42013 Preterm premature rupture of membranes, onset of labor within 24 hours of rupture, third trimester: Principal | ICD-10-CM | POA: Diagnosis present

## 2016-09-25 DIAGNOSIS — O134 Gestational [pregnancy-induced] hypertension without significant proteinuria, complicating childbirth: Secondary | ICD-10-CM | POA: Diagnosis present

## 2016-09-25 LAB — COMPREHENSIVE METABOLIC PANEL
ALBUMIN: 2.8 g/dL — AB (ref 3.5–5.0)
ALK PHOS: 151 U/L — AB (ref 38–126)
ALT: 27 U/L (ref 14–54)
ANION GAP: 9 (ref 5–15)
AST: 36 U/L (ref 15–41)
BUN: 6 mg/dL (ref 6–20)
CALCIUM: 8.8 mg/dL — AB (ref 8.9–10.3)
CHLORIDE: 105 mmol/L (ref 101–111)
CO2: 21 mmol/L — AB (ref 22–32)
Creatinine, Ser: 0.54 mg/dL (ref 0.44–1.00)
GFR calc Af Amer: >60 mL/min (ref >60)
GFR calc non Af Amer: >60 mL/min (ref >60)
GLUCOSE: 91 mg/dL (ref 65–99)
Potassium: 3.3 mmol/L — ABNORMAL LOW (ref 3.5–5.1)
SODIUM: 135 mmol/L (ref 135–145)
Total Bilirubin: 0.5 mg/dL (ref 0.3–1.2)
Total Protein: 6.5 g/dL (ref 6.5–8.1)

## 2016-09-25 LAB — CBC
HEMATOCRIT: 34.1 % — AB (ref 36.0–46.0)
HEMATOCRIT: 34.9 % — AB (ref 36.0–46.0)
HEMOGLOBIN: 11.5 g/dL — AB (ref 12.0–15.0)
HEMOGLOBIN: 11.7 g/dL — AB (ref 12.0–15.0)
MCH: 28 pg (ref 26.0–34.0)
MCH: 28.1 pg (ref 26.0–34.0)
MCHC: 33.7 g/dL (ref 30.0–36.0)
MCHC: 33.5 g/dL (ref 30.0–36.0)
MCV: 83.2 fL (ref 78.0–100.0)
MCV: 83.9 fL (ref 78.0–100.0)
Platelets: 222 10*3/uL (ref 150–400)
Platelets: 234 10*3/uL (ref 150–400)
RBC: 4.1 MIL/uL (ref 3.87–5.11)
RBC: 4.16 MIL/uL (ref 3.87–5.11)
RDW: 15.2 % (ref 11.5–15.5)
RDW: 15.3 % (ref 11.5–15.5)
WBC: 13.7 10*3/uL — ABNORMAL HIGH (ref 4.0–10.5)
WBC: 14.7 10*3/uL — ABNORMAL HIGH (ref 4.0–10.5)

## 2016-09-25 LAB — TYPE AND SCREEN
ABO/RH(D): B POS
Antibody Screen: NEGATIVE

## 2016-09-25 LAB — URIC ACID: URIC ACID, SERUM: 5.3 mg/dL (ref 2.3–6.6)

## 2016-09-25 LAB — RPR: RPR: NONREACTIVE

## 2016-09-25 MED ORDER — DIPHENHYDRAMINE HCL 50 MG/ML IJ SOLN: 12.5000 mg | INTRAMUSCULAR | Status: DC | PRN

## 2016-09-25 MED ORDER — PHENYLEPHRINE 40 MCG/ML (10ML) SYRINGE FOR IV PUSH (FOR BLOOD PRESSURE SUPPORT): 80.0000 ug | PREFILLED_SYRINGE | INTRAVENOUS | Status: DC | PRN

## 2016-09-25 MED ORDER — LACTATED RINGERS IV SOLN: 500.0000 mL | Freq: Once | INTRAVENOUS | Status: DC

## 2016-09-25 MED ORDER — PHENYLEPHRINE 40 MCG/ML (10ML) SYRINGE FOR IV PUSH (FOR BLOOD PRESSURE SUPPORT)
80.0000 ug | PREFILLED_SYRINGE | INTRAVENOUS | Status: DC | PRN
  Filled 2016-09-25: qty 10

## 2016-09-25 MED ORDER — OXYCODONE-ACETAMINOPHEN 5-325 MG PO TABS: 2.0000 | ORAL_TABLET | ORAL | Status: DC | PRN

## 2016-09-25 MED ORDER — OXYTOCIN 40 UNITS IN LACTATED RINGERS INFUSION - SIMPLE MED: 2.5000 [IU]/h | INTRAVENOUS | Status: DC

## 2016-09-25 MED ORDER — LABETALOL HCL 5 MG/ML IV SOLN
INTRAVENOUS | Status: AC
  Administered 2016-09-25: 20 mg via INTRAVENOUS
  Filled 2016-09-25: qty 4

## 2016-09-25 MED ORDER — LIDOCAINE HCL (PF) 1 % IJ SOLN
30.0000 mL | INTRAMUSCULAR | Status: DC | PRN
  Filled 2016-09-25: qty 30

## 2016-09-25 MED ORDER — BUTORPHANOL TARTRATE 2 MG/ML IJ SOLN
2.0000 mg | INTRAMUSCULAR | Status: DC | PRN
  Administered 2016-09-25 (×2): 2 mg via INTRAVENOUS
  Filled 2016-09-25 (×2): qty 2

## 2016-09-25 MED ORDER — EPHEDRINE 5 MG/ML INJ: 10.0000 mg | INTRAVENOUS | Status: DC | PRN

## 2016-09-25 MED ORDER — ONDANSETRON HCL 4 MG/2ML IJ SOLN: 4.0000 mg | Freq: Four times a day (QID) | INTRAMUSCULAR | Status: DC | PRN

## 2016-09-25 MED ORDER — FLEET ENEMA 7-19 GM/118ML RE ENEM: 1.0000 | ENEMA | RECTAL | Status: DC | PRN

## 2016-09-25 MED ORDER — OXYTOCIN BOLUS FROM INFUSION: 500.0000 mL | Freq: Once | INTRAVENOUS | Status: DC

## 2016-09-25 MED ORDER — TERBUTALINE SULFATE 1 MG/ML IJ SOLN: 0.2500 mg | Freq: Once | INTRAMUSCULAR | Status: DC | PRN

## 2016-09-25 MED ORDER — MISOPROSTOL 50MCG HALF TABLET: 50.0000 ug | ORAL_TABLET | ORAL | Status: DC

## 2016-09-25 MED ORDER — HYDRALAZINE HCL 20 MG/ML IJ SOLN: 10.0000 mg | Freq: Once | INTRAMUSCULAR | Status: DC | PRN

## 2016-09-25 MED ORDER — ACETAMINOPHEN 325 MG PO TABS
650.0000 mg | ORAL_TABLET | ORAL | Status: DC | PRN
  Administered 2016-09-26: 650 mg via ORAL
  Filled 2016-09-25 (×2): qty 2

## 2016-09-25 MED ORDER — LACTATED RINGERS IV SOLN: 500.0000 mL | INTRAVENOUS | Status: DC | PRN

## 2016-09-25 MED ORDER — OXYTOCIN 40 UNITS IN LACTATED RINGERS INFUSION - SIMPLE MED
1.0000 m[IU]/min | INTRAVENOUS | Status: DC
  Administered 2016-09-25: 2 m[IU]/min via INTRAVENOUS
  Filled 2016-09-25: qty 1000

## 2016-09-25 MED ORDER — LACTATED RINGERS IV SOLN
INTRAVENOUS | Status: DC
  Administered 2016-09-25 – 2016-09-26 (×2): via INTRAVENOUS

## 2016-09-25 MED ORDER — SOD CITRATE-CITRIC ACID 500-334 MG/5ML PO SOLN
30.0000 mL | ORAL | Status: DC | PRN
  Administered 2016-09-26: 30 mL via ORAL
  Filled 2016-09-25: qty 15

## 2016-09-25 MED ORDER — VANCOMYCIN HCL IN DEXTROSE 1-5 GM/200ML-% IV SOLN
1000.0000 mg | Freq: Two times a day (BID) | INTRAVENOUS | Status: DC
  Administered 2016-09-25 – 2016-09-26 (×3): 1000 mg via INTRAVENOUS
  Filled 2016-09-25 (×3): qty 200

## 2016-09-25 MED ORDER — OXYCODONE-ACETAMINOPHEN 5-325 MG PO TABS: 1.0000 | ORAL_TABLET | ORAL | Status: DC | PRN

## 2016-09-25 MED ORDER — FENTANYL 2.5 MCG/ML BUPIVACAINE 1/10 % EPIDURAL INFUSION (WH - ANES)
14.0000 mL/h | INTRAMUSCULAR | Status: DC | PRN
  Administered 2016-09-25 – 2016-09-26 (×2): 14 mL/h via EPIDURAL
  Filled 2016-09-25 (×2): qty 100

## 2016-09-25 MED ORDER — LABETALOL HCL 5 MG/ML IV SOLN
20.0000 mg | INTRAVENOUS | Status: DC | PRN
  Administered 2016-09-25: 20 mg via INTRAVENOUS
  Administered 2016-09-25: 40 mg via INTRAVENOUS
  Filled 2016-09-25: qty 8

## 2016-09-25 MED ORDER — LIDOCAINE HCL (PF) 1 % IJ SOLN
INTRAMUSCULAR | Status: DC | PRN
  Administered 2016-09-25: 3 mL
  Administered 2016-09-25: 2 mL
  Administered 2016-09-25: 5 mL

## 2016-09-25 NOTE — Anesthesia Procedure Notes (Signed)
Epidural Patient location during procedure: OB Start time: 09/25/2016 6:36 PM End time: 09/25/2016 6:46 PM  Staffing Anesthesiologist: Suzette Battiest Performed: anesthesiologist   Preanesthetic Checklist Completed: patient identified, site marked, surgical consent, pre-op evaluation, timeout performed, IV checked, risks and benefits discussed and monitors and equipment checked  Epidural Patient position: sitting Prep: site prepped and draped and DuraPrep Patient monitoring: continuous pulse ox and blood pressure Approach: midline Location: L4-L5 Injection technique: LOR air  Needle:  Needle type: Tuohy  Needle gauge: 17 G Needle length: 9 cm and 9 Needle insertion depth: 8 cm Catheter type: closed end flexible Catheter size: 19 Gauge Catheter at skin depth: 15 (14-->15cm when laid in lat decub position.) cm Test dose: negative  Assessment Events: blood not aspirated, injection not painful, no injection resistance, negative IV test and no paresthesia

## 2016-09-25 NOTE — Anesthesia Preprocedure Evaluation (Addendum)
Anesthesia Evaluation  Patient identified by MRN, date of birth, ID band Patient awake    Reviewed: Allergy & Precautions, NPO status , Patient's Chart, lab work & pertinent test results  Airway Mallampati: II  TM Distance: >3 FB     Dental   Pulmonary neg pulmonary ROS,    Pulmonary exam normal        Cardiovascular hypertension, Normal cardiovascular exam     Neuro/Psych negative neurological ROS     GI/Hepatic negative GI ROS, Neg liver ROS,   Endo/Other  Morbid obesity  Renal/GU negative Renal ROS     Musculoskeletal   Abdominal   Peds  Hematology negative hematology ROS (+)   Anesthesia Other Findings   Reproductive/Obstetrics (+) Pregnancy C-section for failure to dilate.                            Lab Results  Component Value Date   WBC 14.7 (H) 09/25/2016   HGB 11.7 (L) 09/25/2016   HCT 34.9 (L) 09/25/2016   MCV 83.9 09/25/2016   PLT 234 09/25/2016   Lab Results  Component Value Date   CREATININE 0.54 09/25/2016   BUN 6 09/25/2016   NA 135 09/25/2016   K 3.3 (L) 09/25/2016   CL 105 09/25/2016   CO2 21 (L) 09/25/2016    Anesthesia Physical Anesthesia Plan  ASA: III  Anesthesia Plan: Epidural   Post-op Pain Management:    Induction:   PONV Risk Score and Plan: Treatment may vary due to age or medical condition  Airway Management Planned: Natural Airway  Additional Equipment:   Intra-op Plan:   Post-operative Plan:   Informed Consent: I have reviewed the patients History and Physical, chart, labs and discussed the procedure including the risks, benefits and alternatives for the proposed anesthesia with the patient or authorized representative who has indicated his/her understanding and acceptance.     Plan Discussed with:   Anesthesia Plan Comments:         Anesthesia Quick Evaluation

## 2016-09-25 NOTE — Progress Notes (Signed)
S: breathing with ctx   O: BP 135/64   Pulse 79   Temp 98.5 F (36.9 C) (Axillary)   Resp (!) 22   Ht 5' (1.524 m)   Wt 106.6 kg (235 lb)   LMP 01/18/2016   SpO2 98%   BMI 45.90 kg/m  VE loose 1 cm/70/OOP balloon in place  Tracing; baseline 145  Some accels Ctx q 1-6 mins  IMP: PPROM Latent phase IUP @ 36 2/7 weeks GBS cx (+) on Vancomycin PIH P) cont with pitocin. Right exaggerated sims

## 2016-09-25 NOTE — Progress Notes (Signed)
S: c/o vaginal/rectal pressure   O:  Pitocin 16 miu VS: BP 147/76 VE per RN 5-6/-1 station. Intracervical balloon out Tracing; baseline 145 (+)`variable/early decels Ctx q 2-3 mins IMP: PPROm (+) GBs on vancomycin PIH stable Previous C/s P) Epidural. Cont present mgmt. Repeat CbC

## 2016-09-25 NOTE — MAU Note (Signed)
0230 water broke clear fluid. No bleeding. Baby moving well. Contractions mild to mod, not many.

## 2016-09-25 NOTE — Anesthesia Pain Management Evaluation Note (Signed)
  CRNA Pain Management Visit Note  Patient: Marissa [name], 41 y.o., female  "Hello I am a member of the anesthesia team at [facility], [place]. We have an anesthesia team available at all times to provide care throughout the hospital, including epidural management and anesthesia for C-section. I don't know your plan for the delivery whether it a natural birth, water birth, IV sedation, nitrous supplementation, doula or epidural, but we want to meet your pain goals."   1.Was your pain managed to your expectations on prior hospitalizations?   Yes   2.What is your expectation for pain management during this hospitalization?     Epidural  3.How can we help you reach that goal? Epidural when ready.  Record the patient's initial score and the patient's pain goal.   Pain: 4  Pain Goal: 9 [facility] wants you to be able to say your pain was always managed very well.  Markeda Narvaez L 09/25/2016

## 2016-09-25 NOTE — H&P (Signed)
Marissa [name] is a 41 y.o. female presenting @ 36 2/[redacted] week gestation here for PPROm @  2:30 am. occ Ctx (+) pelvic  Pressure. Prev C/S with VBAC x 2. (+) GBS( sensitivity pending) OB History    Gravida Para Term Preterm AB Living   5 3 3   1 3    SAB TAB Ectopic Multiple Live Births   1       3     Past Medical History:  Diagnosis Date  . Endometrial polyp   . Endometriosis   . Hypertension    Past Surgical History:  Procedure Laterality Date  . CESAREAN SECTION  1995  . HYSTEROSCOPY N/A 04/14/2014   Procedure: HYSTEROSCOPY, SUCTION D&C, RESECTION OF MYOMA;  Surgeon: Governor Specking, MD;  Location: [facility];  Service: Gynecology;  Laterality: N/A;  . LAPAROSCOPY N/A 04/14/2014   Procedure: LAPAROSCOPY, EXCISION AND ABLATION OF ENDOMETRIOSIS, CHROMOTUBATION;  Surgeon: Governor Specking, MD;  Location: [facility];  Service: Gynecology;  Laterality: N/A;  . TONSILLECTOMY  12-21-2003   12-27-2003--  CAUTERIZATION RIGHT TONSILLAR BLEED POST-OP   Family History: family history includes Hypertension in her mother and paternal grandmother; Stroke in her mother and paternal grandmother. Social History:  reports that she has never smoked. She has never used smokeless tobacco. She reports that she does not drink alcohol or use drugs.     Maternal Diabetes: No Genetic Screening: Normal Maternal Ultrasounds/Referrals: Normal Fetal Ultrasounds or other Referrals:  None Maternal Substance Abuse:  No Significant Maternal Medications:  None Significant Maternal Lab Results:  Lab values include: Group B Strep positive Other Comments:  fetal macrosomia  Review of Systems  All other systems reviewed and are negative.  History Dilation: 1.5 Effacement (%): 50 Station: -2, -3 Blood pressure (!) 162/93, pulse 79, temperature 99.1 F (37.3 C), temperature source Axillary, resp. rate 18, height 5' (1.524 m), weight 106.6 kg (235 lb), last menstrual  period 01/18/2016, SpO2 98 %. Exam Physical Exam  Constitutional: She is oriented to person, place, and time. She appears well-developed and well-nourished.  HENT:  Head: Atraumatic.  Eyes: EOM are normal.  Cardiovascular: Regular rhythm.   Respiratory: Breath sounds normal.  Musculoskeletal: She exhibits edema.  Neurological: She is alert and oriented to person, place, and time.  Skin: Skin is warm and dry.   VE: 1/ thick/OOP vtx confirm bladder full Prenatal labs: ABO, Rh: --/--/B POS (07/03 0440) Antibody: NEG (07/03 0440) Rubella: Immune (01/05 0000) RPR: Nonreactive (01/05 0000)  HBsAg: Negative (01/05 0000)  HIV: Non-reactive (01/05 0000)  GBS:   positive CBC    Component Value Date/Time   WBC 13.7 (H) 09/25/2016 0440   RBC 4.10 09/25/2016 0440   HGB 11.5 (L) 09/25/2016 0440   HCT 34.1 (L) 09/25/2016 0440   PLT 222 09/25/2016 0440   MCV 83.2 09/25/2016 0440   MCH 28.0 09/25/2016 0440   MCHC 33.7 09/25/2016 0440   RDW 15.2 09/25/2016 0440   LYMPHSABS 2.1 05/01/2013 1329   MONOABS 0.4 05/01/2013 1329   EOSABS 0.2 05/01/2013 1329   BASOSABS 0.1 05/01/2013 1329   CMP Latest Ref Rng & Units 09/25/2016 04/14/2014 05/01/2013  Glucose 65 - 99 mg/dL 91 81 71  BUN 6 - 20 mg/dL 6 - 9  Creatinine 0.44 - 1.00 mg/dL 0.54 - 0.74  Sodium 135 - 145 mmol/L 135 140 140  Potassium 3.5 - 5.1 mmol/L 3.3(L) 4.3 4.2  Chloride 101 - 111 mmol/L 105 - 103  CO2 22 - 32 mmol/L 21(L) - 26  Calcium 8.9 - 10.3 mg/dL 8.8(L) - 9.4  Total Protein 6.5 - 8.1 g/dL 6.5 - 7.2  Total Bilirubin 0.3 - 1.2 mg/dL 0.5 - 0.5  Alkaline Phos 38 - 126 U/L 151(H) - 70  AST 15 - 41 U/L 36 - 12  ALT 14 - 54 U/L 27 - 11   Assessment/Plan: PPROm @ 36 2/7 weeks  GBS (+) PIH Previous C/S with successful VBAC x2 Fetal macrosomia P) admit PIH labs done. BP med as per protocol prn. Epidural prn. IV vancomycin( pending sensitivity). Watch BP. Will need cervical ripening. Unable to do cytotec due to prior C/S. Will do  intracervical balloon and pitocin  Quintavius Niebuhr A 09/25/2016, 6:18 AM

## 2016-09-26 ENCOUNTER — Encounter (HOSPITAL_COMMUNITY)
Admission: AD | Disposition: A | Discharge status: Home / Self Care | Payer: Self-pay | Source: Ambulatory Visit | Attending: Obstetrics and Gynecology

## 2016-09-26 ENCOUNTER — Encounter (HOSPITAL_COMMUNITY): Payer: Self-pay | Admitting: Obstetrics

## 2016-09-26 DIAGNOSIS — Z98891 History of uterine scar from previous surgery: Secondary | ICD-10-CM | POA: Diagnosis present

## 2016-09-26 LAB — CBC
HCT: 30.2 % — ABNORMAL LOW (ref 36.0–46.0)
HEMOGLOBIN: 10.2 g/dL — AB (ref 12.0–15.0)
MCH: 27.9 pg (ref 26.0–34.0)
MCHC: 33.8 g/dL (ref 30.0–36.0)
MCV: 82.5 fL (ref 78.0–100.0)
Platelets: 158 10*3/uL (ref 150–400)
RBC: 3.66 MIL/uL — AB (ref 3.87–5.11)
RDW: 15.2 % (ref 11.5–15.5)
WBC: 24.3 10*3/uL — ABNORMAL HIGH (ref 4.0–10.5)

## 2016-09-26 SURGERY — Surgical Case
Anesthesia: Epidural | Wound class: Clean Contaminated

## 2016-09-26 MED ORDER — COCONUT OIL OIL: 1.0000 "application " | TOPICAL_OIL | Status: DC | PRN

## 2016-09-26 MED ORDER — SIMETHICONE 80 MG PO CHEW
80.0000 mg | CHEWABLE_TABLET | Freq: Three times a day (TID) | ORAL | Status: DC
  Administered 2016-09-26 – 2016-09-28 (×6): 80 mg via ORAL
  Filled 2016-09-26 (×5): qty 1

## 2016-09-26 MED ORDER — ONDANSETRON HCL 4 MG/2ML IJ SOLN
INTRAMUSCULAR | Status: AC
  Filled 2016-09-26: qty 2

## 2016-09-26 MED ORDER — MENTHOL 3 MG MT LOZG: 1.0000 | LOZENGE | OROMUCOSAL | Status: DC | PRN

## 2016-09-26 MED ORDER — GENTAMICIN SULFATE 40 MG/ML IJ SOLN
5.0000 mg/kg | INTRAVENOUS | Status: DC
  Administered 2016-09-26 – 2016-09-27 (×2): 350 mg via INTRAVENOUS
  Filled 2016-09-26 (×3): qty 8.75

## 2016-09-26 MED ORDER — SCOPOLAMINE 1 MG/3DAYS TD PT72
MEDICATED_PATCH | TRANSDERMAL | Status: AC
  Filled 2016-09-26: qty 1

## 2016-09-26 MED ORDER — OXYCODONE-ACETAMINOPHEN 5-325 MG PO TABS: 1.0000 | ORAL_TABLET | ORAL | Status: DC | PRN

## 2016-09-26 MED ORDER — SIMETHICONE 80 MG PO CHEW: 80.0000 mg | CHEWABLE_TABLET | ORAL | Status: DC | PRN

## 2016-09-26 MED ORDER — NALBUPHINE SYRINGE 5 MG/0.5 ML: 5.0000 mg | INJECTION | INTRAMUSCULAR | Status: DC | PRN

## 2016-09-26 MED ORDER — MORPHINE SULFATE (PF) 0.5 MG/ML IJ SOLN
INTRAMUSCULAR | Status: AC
  Filled 2016-09-26: qty 10

## 2016-09-26 MED ORDER — DIPHENHYDRAMINE HCL 50 MG/ML IJ SOLN
INTRAMUSCULAR | Status: DC | PRN
  Administered 2016-09-26: 25 mg via INTRAVENOUS

## 2016-09-26 MED ORDER — WITCH HAZEL-GLYCERIN EX PADS: 1.0000 "application " | MEDICATED_PAD | CUTANEOUS | Status: DC | PRN

## 2016-09-26 MED ORDER — DIBUCAINE 1 % RE OINT: 1.0000 "application " | TOPICAL_OINTMENT | RECTAL | Status: DC | PRN

## 2016-09-26 MED ORDER — PRENATAL MULTIVITAMIN CH
1.0000 | ORAL_TABLET | Freq: Every day | ORAL | Status: DC
  Administered 2016-09-27 – 2016-09-28 (×2): 1 via ORAL
  Filled 2016-09-26 (×2): qty 1

## 2016-09-26 MED ORDER — SODIUM CHLORIDE 0.9 % IR SOLN
Status: DC | PRN
  Administered 2016-09-26: 1

## 2016-09-26 MED ORDER — KETOROLAC TROMETHAMINE 30 MG/ML IJ SOLN
30.0000 mg | Freq: Four times a day (QID) | INTRAMUSCULAR | Status: DC | PRN
  Administered 2016-09-26: 30 mg via INTRAMUSCULAR

## 2016-09-26 MED ORDER — DIPHENHYDRAMINE HCL 50 MG/ML IJ SOLN
INTRAMUSCULAR | Status: AC
  Filled 2016-09-26: qty 1

## 2016-09-26 MED ORDER — MEPERIDINE HCL 25 MG/ML IJ SOLN: 6.2500 mg | INTRAMUSCULAR | Status: DC | PRN

## 2016-09-26 MED ORDER — OXYCODONE-ACETAMINOPHEN 5-325 MG PO TABS: 2.0000 | ORAL_TABLET | ORAL | Status: DC | PRN

## 2016-09-26 MED ORDER — ONDANSETRON HCL 4 MG/2ML IJ SOLN: 4.0000 mg | Freq: Three times a day (TID) | INTRAMUSCULAR | Status: DC | PRN

## 2016-09-26 MED ORDER — SCOPOLAMINE 1 MG/3DAYS TD PT72: 1.0000 | MEDICATED_PATCH | Freq: Once | TRANSDERMAL | Status: DC

## 2016-09-26 MED ORDER — LACTATED RINGERS IV SOLN
INTRAVENOUS | Status: DC
  Administered 2016-09-26 (×2): via INTRAVENOUS

## 2016-09-26 MED ORDER — OXYTOCIN 40 UNITS IN LACTATED RINGERS INFUSION - SIMPLE MED: 2.5000 [IU]/h | INTRAVENOUS | Status: DC

## 2016-09-26 MED ORDER — LACTATED RINGERS IV SOLN
INTRAVENOUS | Status: DC | PRN
  Administered 2016-09-26 (×2): via INTRAVENOUS

## 2016-09-26 MED ORDER — DEXAMETHASONE SODIUM PHOSPHATE 4 MG/ML IJ SOLN
INTRAMUSCULAR | Status: DC | PRN
  Administered 2016-09-26: 4 mg via INTRAVENOUS

## 2016-09-26 MED ORDER — OXYTOCIN 10 UNIT/ML IJ SOLN
INTRAMUSCULAR | Status: AC
Start: 2016-09-26 — End: 2016-09-26
  Filled 2016-09-26: qty 4

## 2016-09-26 MED ORDER — NALBUPHINE SYRINGE 5 MG/0.5 ML: 5.0000 mg | INJECTION | Freq: Once | INTRAMUSCULAR | Status: DC | PRN

## 2016-09-26 MED ORDER — BUPIVACAINE HCL (PF) 0.25 % IJ SOLN
INTRAMUSCULAR | Status: AC
  Filled 2016-09-26: qty 30

## 2016-09-26 MED ORDER — BUPIVACAINE HCL (PF) 0.25 % IJ SOLN
INTRAMUSCULAR | Status: DC | PRN
  Administered 2016-09-26: 30 mL

## 2016-09-26 MED ORDER — SIMETHICONE 80 MG PO CHEW
80.0000 mg | CHEWABLE_TABLET | ORAL | Status: DC
  Administered 2016-09-26 – 2016-09-27 (×2): 80 mg via ORAL
  Filled 2016-09-26 (×2): qty 1

## 2016-09-26 MED ORDER — DIPHENHYDRAMINE HCL 25 MG PO CAPS: 25.0000 mg | ORAL_CAPSULE | ORAL | Status: DC | PRN

## 2016-09-26 MED ORDER — SCOPOLAMINE 1 MG/3DAYS TD PT72
MEDICATED_PATCH | TRANSDERMAL | Status: DC | PRN
  Administered 2016-09-26: 1 via TRANSDERMAL

## 2016-09-26 MED ORDER — LACTATED RINGERS IV SOLN
INTRAVENOUS | Status: DC | PRN
  Administered 2016-09-26: 40 [IU] via INTRAVENOUS

## 2016-09-26 MED ORDER — KETOROLAC TROMETHAMINE 30 MG/ML IJ SOLN
INTRAMUSCULAR | Status: AC
  Filled 2016-09-26: qty 1

## 2016-09-26 MED ORDER — SENNOSIDES-DOCUSATE SODIUM 8.6-50 MG PO TABS
2.0000 | ORAL_TABLET | ORAL | Status: DC
  Administered 2016-09-26 – 2016-09-27 (×2): 2 via ORAL
  Filled 2016-09-26 (×2): qty 2

## 2016-09-26 MED ORDER — MEPERIDINE HCL 25 MG/ML IJ SOLN
INTRAMUSCULAR | Status: AC
  Filled 2016-09-26: qty 1

## 2016-09-26 MED ORDER — DEXTROSE 5 % IV SOLN
1.0000 ug/kg/h | INTRAVENOUS | Status: DC | PRN
  Filled 2016-09-26: qty 2

## 2016-09-26 MED ORDER — ACETAMINOPHEN 325 MG PO TABS
325.0000 mg | ORAL_TABLET | Freq: Once | ORAL | Status: AC
  Administered 2016-09-26: 325 mg via ORAL

## 2016-09-26 MED ORDER — ZOLPIDEM TARTRATE 5 MG PO TABS: 5.0000 mg | ORAL_TABLET | Freq: Every evening | ORAL | Status: DC | PRN

## 2016-09-26 MED ORDER — VANCOMYCIN HCL IN DEXTROSE 1-5 GM/200ML-% IV SOLN
1000.0000 mg | Freq: Two times a day (BID) | INTRAVENOUS | Status: AC
  Administered 2016-09-26 – 2016-09-27 (×2): 1000 mg via INTRAVENOUS
  Filled 2016-09-26 (×2): qty 200

## 2016-09-26 MED ORDER — MEPERIDINE HCL 25 MG/ML IJ SOLN
INTRAMUSCULAR | Status: DC | PRN
  Administered 2016-09-26 (×2): 12.5 mg via INTRAVENOUS

## 2016-09-26 MED ORDER — ONDANSETRON HCL 4 MG/2ML IJ SOLN
INTRAMUSCULAR | Status: DC | PRN
  Administered 2016-09-26: 4 mg via INTRAVENOUS

## 2016-09-26 MED ORDER — LIDOCAINE-EPINEPHRINE (PF) 2 %-1:200000 IJ SOLN
INTRAMUSCULAR | Status: DC | PRN
  Administered 2016-09-26 (×2): 5 mL via INTRADERMAL

## 2016-09-26 MED ORDER — MORPHINE SULFATE (PF) 0.5 MG/ML IJ SOLN
INTRAMUSCULAR | Status: DC | PRN
  Administered 2016-09-26: 4 mg via EPIDURAL
  Administered 2016-09-26: 1 mg via EPIDURAL

## 2016-09-26 MED ORDER — DIPHENHYDRAMINE HCL 25 MG PO CAPS: 25.0000 mg | ORAL_CAPSULE | Freq: Four times a day (QID) | ORAL | Status: DC | PRN

## 2016-09-26 MED ORDER — DIPHENHYDRAMINE HCL 50 MG/ML IJ SOLN: 12.5000 mg | INTRAMUSCULAR | Status: DC | PRN

## 2016-09-26 MED ORDER — KETOROLAC TROMETHAMINE 30 MG/ML IJ SOLN: 30.0000 mg | Freq: Four times a day (QID) | INTRAMUSCULAR | Status: DC | PRN

## 2016-09-26 MED ORDER — IBUPROFEN 600 MG PO TABS
600.0000 mg | ORAL_TABLET | Freq: Four times a day (QID) | ORAL | Status: DC
  Administered 2016-09-26 – 2016-09-28 (×8): 600 mg via ORAL
  Filled 2016-09-26 (×8): qty 1

## 2016-09-26 MED ORDER — SODIUM CHLORIDE 0.9% FLUSH: 3.0000 mL | INTRAVENOUS | Status: DC | PRN

## 2016-09-26 MED ORDER — NALOXONE HCL 0.4 MG/ML IJ SOLN: 0.4000 mg | INTRAMUSCULAR | Status: DC | PRN

## 2016-09-26 MED ORDER — DEXAMETHASONE SODIUM PHOSPHATE 4 MG/ML IJ SOLN
INTRAMUSCULAR | Status: AC
  Filled 2016-09-26: qty 1

## 2016-09-26 MED ORDER — PHENYLEPHRINE HCL 10 MG/ML IJ SOLN
INTRAMUSCULAR | Status: DC | PRN
  Administered 2016-09-26 (×3): 120 ug via INTRAVENOUS
  Administered 2016-09-26: 40 ug via INTRAVENOUS

## 2016-09-26 MED ORDER — FENTANYL CITRATE (PF) 100 MCG/2ML IJ SOLN: 25.0000 ug | INTRAMUSCULAR | Status: DC | PRN

## 2016-09-26 SURGICAL SUPPLY — 47 items
BARRIER ADHS 3X4 INTERCEED (GAUZE/BANDAGES/DRESSINGS) ×3 IMPLANT
BENZOIN TINCTURE PRP APPL 2/3 (GAUZE/BANDAGES/DRESSINGS) ×3 IMPLANT
CHLORAPREP W/TINT 26ML (MISCELLANEOUS) ×3 IMPLANT
CLAMP CORD UMBIL (MISCELLANEOUS) ×3 IMPLANT
CLOSURE STERI STRIP 1/2 X4 (GAUZE/BANDAGES/DRESSINGS) ×2 IMPLANT
CLOSURE WOUND 1/2 X4 (GAUZE/BANDAGES/DRESSINGS) ×1
CLOTH BEACON ORANGE TIMEOUT ST (SAFETY) ×3 IMPLANT
CONTAINER PREFILL 10% NBF 15ML (MISCELLANEOUS) IMPLANT
DRAPE C SECTION CLR SCREEN (DRAPES) ×3 IMPLANT
DRSG OPSITE POSTOP 4X10 (GAUZE/BANDAGES/DRESSINGS) ×3 IMPLANT
ELECT REM PT RETURN 9FT ADLT (ELECTROSURGICAL) ×3
ELECTRODE REM PT RTRN 9FT ADLT (ELECTROSURGICAL) ×1 IMPLANT
EXTRACTOR VACUUM M CUP 4 TUBE (SUCTIONS) IMPLANT
EXTRACTOR VACUUM M CUP 4' TUBE (SUCTIONS)
GLOVE BIOGEL PI IND STRL 7.0 (GLOVE) ×2 IMPLANT
GLOVE BIOGEL PI INDICATOR 7.0 (GLOVE) ×4
GLOVE ECLIPSE 6.5 STRL STRAW (GLOVE) ×3 IMPLANT
GOWN STRL REUS W/TWL LRG LVL3 (GOWN DISPOSABLE) ×6 IMPLANT
KIT ABG SYR 3ML LUER SLIP (SYRINGE) IMPLANT
NEEDLE HYPO 22GX1.5 SAFETY (NEEDLE) ×3 IMPLANT
NEEDLE HYPO 25X5/8 SAFETYGLIDE (NEEDLE) IMPLANT
NS IRRIG 1000ML POUR BTL (IV SOLUTION) ×3 IMPLANT
PACK C SECTION WH (CUSTOM PROCEDURE TRAY) ×3 IMPLANT
PAD OB MATERNITY 4.3X12.25 (PERSONAL CARE ITEMS) ×3 IMPLANT
RETRACTOR TRAXI PANNICULUS (MISCELLANEOUS) ×1 IMPLANT
RTRCTR C-SECT PINK 25CM LRG (MISCELLANEOUS) IMPLANT
STRIP CLOSURE SKIN 1/2X4 (GAUZE/BANDAGES/DRESSINGS) ×2 IMPLANT
SUT CHROMIC GUT AB #0 18 (SUTURE) IMPLANT
SUT MNCRL 0 VIOLET CTX 36 (SUTURE) ×4 IMPLANT
SUT MON AB 2-0 SH 27 (SUTURE)
SUT MON AB 2-0 SH27 (SUTURE) IMPLANT
SUT MON AB 3-0 SH 27 (SUTURE)
SUT MON AB 3-0 SH27 (SUTURE) IMPLANT
SUT MON AB 4-0 PS1 27 (SUTURE) IMPLANT
SUT MONOCRYL 0 CTX 36 (SUTURE) ×8
SUT PLAIN 2 0 (SUTURE) ×2
SUT PLAIN 2 0 XLH (SUTURE) IMPLANT
SUT PLAIN ABS 2-0 CT1 27XMFL (SUTURE) ×1 IMPLANT
SUT VIC AB 0 CT1 36 (SUTURE) ×6 IMPLANT
SUT VIC AB 2-0 CT1 27 (SUTURE) ×2
SUT VIC AB 2-0 CT1 TAPERPNT 27 (SUTURE) ×1 IMPLANT
SUT VIC AB 4-0 KS 27 (SUTURE) ×3 IMPLANT
SUT VIC AB 4-0 PS2 27 (SUTURE) IMPLANT
SYR CONTROL 10ML LL (SYRINGE) ×3 IMPLANT
TOWEL OR 17X24 6PK STRL BLUE (TOWEL DISPOSABLE) ×3 IMPLANT
TRAXI PANNICULUS RETRACTOR (MISCELLANEOUS) ×2
TRAY FOLEY BAG SILVER LF 14FR (SET/KITS/TRAYS/PACK) IMPLANT

## 2016-09-26 NOTE — Consult Note (Signed)
Neonatology Note:   Attendance at C-section:    I was asked by Dr. Garwin Brothers to attend this repeat C/S at 36 3/7 weeks after SROM and failure to progress in labor. The mother is a G5P3A1 B pos, GBS pos with preterm labor, polyhydramnios, and fetal macrosomia. ROM 28 hours prior to delivery, fluid clear. Treated with Vancomycin for > 24 hours (penicillin allergy) and got a dose of Gentamicin about 2.5 hours before delivery, after having a temperature of 100.4 degrees. Infant vigorous with good spontaneous cry and tone. Delayed cord clamping was done. Needed only minimal bulb suctioning by OB. Ap 9/9. Lungs clear to ausc in DR. To CN to care of Pediatrician.  Utilizing the [facility] early onset sepsis calculator, this well-appearing infant does not require labs or antibiotics. However, if she should have persistent symptoms, empiric antibiotics would be recommended.   Real Cons, MD

## 2016-09-26 NOTE — Progress Notes (Signed)
ANTIBIOTIC CONSULT NOTE - INITIAL  Pharmacy Consult for Gentamicin Indication: Chorioamnionitis   Allergies  Allergen Reactions  . Penicillins Hives    Has patient had a PCN reaction causing immediate rash, facial/tongue/throat swelling, SOB or lightheadedness with hypotension: No Has patient had a PCN reaction causing severe rash involving mucus membranes or skin necrosis: No Has patient had a PCN reaction that required hospitalization: No Has patient had a PCN reaction occurring within the last 10 years: No If all of the above answers are "NO", then may proceed with Cephalosporin use.     Patient Measurements: Height: 5' (152.4 cm) Weight: 235 lb (106.6 kg) IBW/kg (Calculated) : 45.5 Adjusted Body Weight: 69.9 kg  Vital Signs: Temp: 100.4 F (38 C) (07/04 0138) Temp Source: Axillary (07/04 0138) BP: 147/75 (07/04 0101) Pulse Rate: 89 (07/04 0101)  Labs:  Recent Labs  09/25/16 0440 09/25/16 1816  WBC 13.7* 14.7*  HGB 11.5* 11.7*  PLT 222 234  CREATININE 0.54  --    No results for input(s): GENTTROUGH, GENTPEAK, GENTRANDOM in the last 72 hours.   Microbiology: No results found for this or any previous visit (from the past 720 hour(s)).  Medications:  Vancomycin 1 gram Q12 hr for GBS px  Assessment: 41 y.o. female [id number] at [redacted]w[redacted]d admitted for PPROM, ctx, and pelvic pressure.  Starting gentamicin for presumed chorioamnionitis.  Previous c/s with VBAC x2.  Spoke with Dr. Garwin Brothers about doing 5mg /kg dosing to ensure adequate coverage if pt goes to OR for C/S.    Plan:  Gentamicin 350 mg IV Q24 hours Check Scr with next labs if gentamicin continued. Will check gentamicin levels if continued > 72hr or clinically indicated.  Sophiea Ueda Scarlett 09/26/2016,3:25 AM

## 2016-09-26 NOTE — Progress Notes (Signed)
S: no complaint  O: BP (!) 149/78   Pulse 88   Temp 100 F (37.8 C) (Axillary)   Resp 18   Ht 5' (1.524 m)   Wt 106.6 kg (235 lb)   LMP 01/18/2016   SpO2 98%   BMI 45.90 kg/m  VE unchanged per RN  Tracing: baseline 160 (+) intermittent variable decels good variability Ctx q 2-3 mins IMP: arrest of dilation Presumed chorioamnionitis PIH Previous C/S P) repeat C/S. Risk of surgery reviewed including infection, bleeding , poss need for blood  Transfusion and its risk, injury to surrounding organ structures, all ? Answered. OR notified

## 2016-09-26 NOTE — Lactation Note (Addendum)
This note was copied from a baby's chart. Lactation Consultation Note  Patient Name: Marissa [name] Today's Date: 09/26/2016  Initial visit at 58 hours of age.  Mom reports good feedings, denies pain with latching and denies concerns at this time.  Baby is [redacted]w[redacted]d and 7#5oz and has started LPTI feeding policy with pumping and supplementing.  Baby has order from Dr. To supplement with formula with feedings due to low glucose levels.  Mom to post pump with DEBP to help establish a good milk supply, mom encouraged to hand express after and save for next feeding and give EBM prior to formula.  [facility] LC resources given and discussed.  Encouraged to feed with early cues on demand. Mom to wake baby as needed.  Early newborn behavior discussed.  Hand expression reported with colostrum visible.  Mom to call for assist as needed.       Maternal Data    Feeding Feeding Type: Breast Milk with Formula added  LATCH Score/Interventions Latch: Repeated attempts needed to sustain latch, nipple held in mouth throughout feeding, stimulation needed to elicit sucking reflex.  Audible Swallowing: Spontaneous and intermittent  Type of Nipple: Everted at rest and after stimulation  Comfort (Breast/Nipple): Soft / non-tender     Hold (Positioning): Assistance needed to correctly position infant at breast and maintain latch.  LATCH Score: 8  Lactation Tools Discussed/Used     Consult Status      [name], Justine Null 09/26/2016, 8:17 PM

## 2016-09-26 NOTE — Transfer of Care (Signed)
Immediate Anesthesia Transfer of Care Note  Patient: Marissa [name]  Procedure(s) Performed: Procedure(s): CESAREAN SECTION (N/A)  Patient Location: PACU  Anesthesia Type:Epidural  Level of Consciousness: awake, alert  and oriented  Airway & Oxygen Therapy: Patient Spontanous Breathing  Post-op Assessment: Report given to RN  Post vital signs: Reviewed and stable  Last Vitals:  Vitals:   09/26/16 0501 09/26/16 0732  BP: (!) 149/78 (!) 121/91  Pulse: 88 86  Resp: 18   Temp:  36.7 C    Last Pain:  Vitals:   09/26/16 0732  TempSrc: Oral  PainSc:       Patients Stated Pain Goal: 0 (03/52/48 1859)  Complications: No apparent anesthesia complications

## 2016-09-26 NOTE — Anesthesia Postprocedure Evaluation (Signed)
Anesthesia Post Note  Patient: Marissa [name]  Procedure(s) Performed: Procedure(s) (LRB): CESAREAN SECTION (N/A)     Patient location during evaluation: Mother Baby Anesthesia Type: Epidural Level of consciousness: awake, awake and alert, oriented and patient cooperative Pain management: pain level controlled Vital Signs Assessment: post-procedure vital signs reviewed and stable Respiratory status: spontaneous breathing, nonlabored ventilation and respiratory function stable Cardiovascular status: stable Postop Assessment: no headache, no backache, patient able to bend at knees and no signs of nausea or vomiting Anesthetic complications: no    Last Vitals:  Vitals:   09/26/16 1045 09/26/16 1145  BP: 124/68   Pulse: 71   Resp: 18 18  Temp: 36.7 C 36.7 C    Last Pain:  Vitals:   09/26/16 1145  TempSrc: Oral  PainSc: 0-No pain   Pain Goal: Patients Stated Pain Goal: 3 (09/26/16 1045)               Penny Frisbie L

## 2016-09-26 NOTE — Brief Op Note (Signed)
09/25/2016 - 09/26/2016  7:09 AM  PATIENT:  Marissa [name]  41 y.o. female  PRE-OPERATIVE DIAGNOSIS:  Repeat Cesarean section for arrest of dilation,    presumed chorioamnionitis, PIH  POST-OPERATIVE DIAGNOSIS:  Repeat Cesarean section for arrest of dilation,  presumed chorioamnionitis, PIH  PROCEDURE:  Repeat Cesarean section, kerr hysterotomy  SURGEON:  Surgeon(s) and Role:    * Servando Salina, MD - Primary  PHYSICIAN ASSISTANT:   ASSISTANTS: Lars Pinks, CNM   ANESTHESIA:   epidural FINDINGS: live female LOT periovarian bilaterally adhesions, ant placenta, nl tubes  EBL:  No intake/output data recorded.  BLOOD ADMINISTERED:none  DRAINS: none   LOCAL MEDICATIONS USED:  MARCAINE     SPECIMEN:  Source of Specimen:  placenta  DISPOSITION OF SPECIMEN:  PATHOLOGY  COUNTS:  YES  TOURNIQUET:  * No tourniquets in log *  DICTATION: .Other Dictation: Dictation Number [id number]  PLAN OF CARE: Other Dictation: Dictation Number [id number]  PATIENT DISPOSITION:  PACU - hemodynamically stable.   Delay start of Pharmacological VTE agent (>24hrs) due to surgical blood loss or risk of bleeding: no

## 2016-09-26 NOTE — Anesthesia Postprocedure Evaluation (Signed)
Anesthesia Post Note  Patient: Marissa [name]  Procedure(s) Performed: Procedure(s) (LRB): CESAREAN SECTION (N/A)     Patient location during evaluation: Mother Baby Anesthesia Type: Epidural Level of consciousness: awake and alert Pain management: pain level controlled Vital Signs Assessment: post-procedure vital signs reviewed and stable Respiratory status: spontaneous breathing, nonlabored ventilation and respiratory function stable Cardiovascular status: stable Postop Assessment: no headache, no backache and epidural receding Anesthetic complications: no    Last Vitals:  Vitals:   09/26/16 0815 09/26/16 0821  BP: 129/78   Pulse: 82   Resp: 19   Temp:  37.2 C    Last Pain:  Vitals:   09/26/16 0821  TempSrc: Oral  PainSc:    Pain Goal: Patients Stated Pain Goal: 0 (09/25/16 0339)               Lynda Rainwater

## 2016-09-26 NOTE — Progress Notes (Signed)
S: comfortable Denies vaginal or rectal pressure  O:BP (!) 160/89   Pulse 89   Temp 100 F (37.8 C) (Axillary)   Resp 18   Ht 5' (1.524 m)   Wt 106.6 kg (235 lb)   LMP 01/18/2016   SpO2 98%   BMI 45.90 kg/m   pitocin Epidural VE right side of cervix edematous but reducible 6/80-90/-2 asynclitic IUPC placed Tracing: baseline 160 (+) accels intermittent variables Ctx q 2-3 mins  IMP: Arrest of dilation Presumed chorioamnionitis (+) GBS on IV vancomycin PPROM PIH P) add additional tylenol of 325mg . Start Gentamycin. Recheck in 2hr  If no change C/S.  Pt and husband agree with plan

## 2016-09-26 NOTE — Addendum Note (Signed)
Addendum  created 09/26/16 1250 by Raenette Rover, CRNA   Sign clinical note

## 2016-09-27 ENCOUNTER — Encounter (HOSPITAL_COMMUNITY): Payer: Self-pay | Admitting: Obstetrics and Gynecology

## 2016-09-27 LAB — COMPREHENSIVE METABOLIC PANEL
ALBUMIN: 2.2 g/dL — AB (ref 3.5–5.0)
ALK PHOS: 102 U/L (ref 38–126)
ALT: 24 U/L (ref 14–54)
AST: 38 U/L (ref 15–41)
Anion gap: 6 (ref 5–15)
BILIRUBIN TOTAL: 0.4 mg/dL (ref 0.3–1.2)
BUN: 10 mg/dL (ref 6–20)
CO2: 25 mmol/L (ref 22–32)
CREATININE: 0.94 mg/dL (ref 0.44–1.00)
Calcium: 9 mg/dL (ref 8.9–10.3)
Chloride: 105 mmol/L (ref 101–111)
GFR calc Af Amer: >60 mL/min (ref >60)
GFR calc non Af Amer: >60 mL/min (ref >60)
GLUCOSE: 89 mg/dL (ref 65–99)
POTASSIUM: 3.2 mmol/L — AB (ref 3.5–5.1)
Sodium: 136 mmol/L (ref 135–145)
TOTAL PROTEIN: 5.6 g/dL — AB (ref 6.5–8.1)

## 2016-09-27 LAB — CBC
HCT: 23.5 % — ABNORMAL LOW (ref 36.0–46.0)
HEMATOCRIT: 23.3 % — AB (ref 36.0–46.0)
HEMOGLOBIN: 8.2 g/dL — AB (ref 12.0–15.0)
HEMOGLOBIN: 8 g/dL — AB (ref 12.0–15.0)
MCH: 29 pg (ref 26.0–34.0)
MCH: 28.6 pg (ref 26.0–34.0)
MCHC: 34.9 g/dL (ref 30.0–36.0)
MCHC: 34.3 g/dL (ref 30.0–36.0)
MCV: 83 fL (ref 78.0–100.0)
MCV: 83.2 fL (ref 78.0–100.0)
PLATELETS: 161 10*3/uL (ref 150–400)
Platelets: 169 10*3/uL (ref 150–400)
RBC: 2.83 MIL/uL — AB (ref 3.87–5.11)
RBC: 2.8 MIL/uL — AB (ref 3.87–5.11)
RDW: 15.4 % (ref 11.5–15.5)
RDW: 15.6 % — AB (ref 11.5–15.5)
WBC: 20.8 10*3/uL — AB (ref 4.0–10.5)
WBC: 16.4 10*3/uL — AB (ref 4.0–10.5)

## 2016-09-27 LAB — URIC ACID: Uric Acid, Serum: 5.9 mg/dL (ref 2.3–6.6)

## 2016-09-27 MED ORDER — HYDROCHLOROTHIAZIDE 25 MG PO TABS
25.0000 mg | ORAL_TABLET | Freq: Every day | ORAL | Status: DC
Start: 2016-09-27 — End: 2016-09-28
  Administered 2016-09-27 – 2016-09-28 (×2): 25 mg via ORAL
  Filled 2016-09-27 (×3): qty 1

## 2016-09-27 MED ORDER — MAGNESIUM OXIDE 400 (241.3 MG) MG PO TABS
400.0000 mg | ORAL_TABLET | Freq: Every day | ORAL | Status: DC
  Administered 2016-09-27 – 2016-09-28 (×2): 400 mg via ORAL
  Filled 2016-09-27 (×3): qty 1

## 2016-09-27 MED ORDER — ACETAMINOPHEN 500 MG PO TABS
1000.0000 mg | ORAL_TABLET | Freq: Once | ORAL | Status: AC
  Administered 2016-09-27: 1000 mg via ORAL
  Filled 2016-09-27: qty 2

## 2016-09-27 MED ORDER — LORATADINE 10 MG PO TABS
10.0000 mg | ORAL_TABLET | Freq: Every day | ORAL | Status: DC
  Administered 2016-09-27 – 2016-09-28 (×2): 10 mg via ORAL
  Filled 2016-09-27 (×2): qty 1

## 2016-09-27 MED ORDER — POLYSACCHARIDE IRON COMPLEX 150 MG PO CAPS
150.0000 mg | ORAL_CAPSULE | Freq: Every day | ORAL | Status: DC
  Administered 2016-09-28: 150 mg via ORAL
  Filled 2016-09-27: qty 1

## 2016-09-27 MED ORDER — POTASSIUM CHLORIDE CRYS ER 20 MEQ PO TBCR
40.0000 meq | EXTENDED_RELEASE_TABLET | Freq: Two times a day (BID) | ORAL | Status: DC
  Administered 2016-09-27 – 2016-09-28 (×2): 40 meq via ORAL
  Filled 2016-09-27 (×5): qty 2

## 2016-09-27 NOTE — Lactation Note (Signed)
This note was copied from a baby's chart. Lactation Consultation Note: Mother reports that infant breastfed for 15 mins and she also gave 15 ml of formula with a curved tip syringe at the breast. Mother concerned that she doesn't have a lot of colostrum. Reviewed hand expression with mother and observed small drops. Mother reports that her milk came in quickly with her other children.  Assist mother with latching infant on in football hold. Observed infant with good pattern of suckling and swallows. Mother concerned that she is giving infant to much formula and doesn't want her to get used to it. Reviewed LPI guidelines again on supplementation. Advised mother to continue to post pump every 2-3 hours for 15-20 mins. Mother advised to use good support when breastfeeding . Mother taught to do breast compression.  Mother receptive to all teaching.   Patient Name: Girl Xiana Carns [place] KAJGO'T Date: 09/27/2016 Reason for consult: Follow-up assessment   Maternal Data    Feeding Feeding Type: Breast Fed Length of feed: 15 min  LATCH Score/Interventions Latch: Grasps breast easily, tongue down, lips flanged, rhythmical sucking.  Audible Swallowing: A few with stimulation  Type of Nipple: Everted at rest and after stimulation  Comfort (Breast/Nipple): Soft / non-tender     Hold (Positioning): Assistance needed to correctly position infant at breast and maintain latch. Intervention(s): Support Pillows;Position options  LATCH Score: 8  Lactation Tools Discussed/Used     Consult Status Consult Status: Follow-up Date: 09/27/16 Follow-up type: In-patient    Jess Barters [facility] 09/27/2016, 2:55 PM

## 2016-09-27 NOTE — Progress Notes (Signed)
POSTOPERATIVE DAY # 1 S/P CS  S:         Reports feeling ok             Tolerating po intake / no nausea / no vomiting / no flatus / no BM             Bleeding is light             Pain controlled with motrin and oxycodone             Up ad lib / ambulatory/ voiding QS  Newborn breast feeding    O:  VS: BP (!) 123/58 (BP Location: Right Arm)   Pulse (!) 59   Temp 98.5 F (36.9 C) (Oral)   Resp 18   Ht 5' (1.524 m)   Wt 106.6 kg (235 lb)   LMP 01/18/2016   SpO2 99%   Breastfeeding? Unknown   BMI 45.90 kg/m    LABS:               Recent Labs  09/26/16 0820 09/27/16 0511  WBC 24.3* 20.8*  HGB 10.2* 8.2*  PLT 158 161               Bloodtype: --/--/B POS (07/03 0440)  Rubella: Immune (01/05 0000)                                             I&O: Intake/Output      07/04 0701 - 07/05 0700 07/05 0701 - 07/06 0700   I.V. (mL/kg) 1062.5 (10)    Total Intake(mL/kg) 1062.5 (10)    Urine (mL/kg/hr) 2925 (1.1)    Blood 50 (0)    Total Output 2975     Net -1912.5                     Physical Exam:             Alert and Oriented X3  Lungs: Clear and unlabored  Heart: regular rate and rhythm / no mumurs  Abdomen: soft, non-tender, non-distended              Fundus: firm, non-tender, Ueven             Dressing intact without drainage        Lochia: light  Extremities: trace edema, no calf pain or tenderness  A:        POD # 1 S/P CS             Presumed chorioamnionitis - afebrile since 0200 this am on gentamycin            ABL anemia             PIH with stable BP / no PIH symptoms  P:        Routine postoperative care              Complete remaining dose ABX then DC if remains afebrile             Start iron and magnesium in AM               Artelia Laroche CNM, MSN, [place] 09/27/2016, 9:36 AM

## 2016-09-27 NOTE — Op Note (Signed)
NAME:  Marissa [name], Marissa [name]        ACCOUNT NO.:  MEDICAL RECORD NO.:  [id number]  LOCATION:                                 FACILITY:  PHYSICIAN:  Servando Salina, M.D.    DATE OF BIRTH:  DATE OF PROCEDURE:  09/26/2016 DATE OF DISCHARGE:                              OPERATIVE REPORT   PREOPERATIVE DIAGNOSES:  Arrest of dilatation, previous cesarean section, pregnancy-induced hypertension, presumed chorioamnionitis, intrauterine gestation at 36+ weeks.  PROCEDURES:  Repeat cesarean section, Kerr hysterotomy.  POSTOPERATIVE DIAGNOSES:  Arrest of dilatation, previous cesarean section, pregnancy-induced hypertension, presumed chorioamnionitis, intrauterine gestation at 36+ weeks.  ANESTHESIA:  Epidural.  SURGEON:  Servando Salina, M.D.  ASSISTANT:  Lars Pinks, CNM.  DESCRIPTION OF PROCEDURE:  Under adequate epidural anesthesia, the patient was placed in the supine position with a left lateral tilt.  She was sterilely prepped and draped in usual fashion.  The bladder had already had an indwelling Foley catheter draining small amount of urine. The patient was sterilely prepped and draped in the usual fashion. Marcaine 0.25% was injected along the previous Pfannenstiel skin incision site.  Pfannenstiel skin incision was then made, carried down to the rectus fascia.  Rectus fascia opened transversely with cautery. Rectus fascia then bluntly and sharply dissected off the rectus muscle in a superior and inferior fashion.  The rectus muscle was split and was already partially separated.  The parietal peritoneum was entered bluntly and sharply and extended.  Copious amount of fluid was noted from the abdominal cavity.  A self-retaining Alexis retractor was then placed.  The vesicouterine peritoneum was opened transversely.  The bladder was displaced inferiorly with sharp dissection.  A curvilinear low-transverse incision was then made and extended with  bandage scissors.  Subsequent delivery of a live female from the left occiput transverse position.  The baby had contraction, did not deliver the shoulder, therefore the left arm was delivered, for which then allowed for thyroid to deliver the rest of the body of the fetus.  The cord was delayed, clamped, cut and transferred to the awaiting pediatricians, who assigned Apgars of 8 and 9 at 1 and 5 minutes.  Placenta was manually removed.  Uterine cavity was cleaned of debris.  Uterine incision was notable for a midline inferior extension, which required separate closure using 0 Monocryl running lock stitch and imbricating at the base with 0 Monocryl suture.  The remaining incision was then closed with 0 Monocryl running lock stitch, followed by imbrication of 0 Monocryl second layer of closure.  A single figure-of-eight suture was placed in the left angle for bleeding. Periovarian adhesions noted bilaterally.  Tubes were noted to be normal.  The abdomen was irrigated and suctioned of debris.  The Alexis retractor was removed after Interceed was placed in inverted T fashion.  The parietal peritoneum was then closed with 2-0 Vicryl.  The rectus fascia was closed with 0 Vicryl x2.  The subcutaneous area was irrigated, small bleeders cauterized.  Interrupted 2-0 plain sutures placed and the skin approximated using 4-0 Vicryl subcuticular closure.  Steri-Strips and benzoin were placed.  SPECIMEN:  Placenta sent to Pathology.  ESTIMATED BLOOD LOSS:  800 mL.  INTRAOPERATIVE FLUIDS:  2600 mL.  URINE  OUTPUT:  200 mL.  SPONGE AND INSTRUMENT COUNT:  Counts x2 was correct.  COMPLICATION:  None.  The patient tolerated the procedure well, was transferred to recovery room in stable condition.     Servando Salina, M.D.     [place]/MEDQ  D:  09/26/2016  T:  09/26/2016  Job:  [id number]

## 2016-09-28 MED ORDER — HYDROCHLOROTHIAZIDE 25 MG PO TABS
25.0000 mg | ORAL_TABLET | Freq: Every day | ORAL | 0 refills | Status: DC
Start: 2016-09-29 — End: 2018-01-29

## 2016-09-28 MED ORDER — POTASSIUM CHLORIDE CRYS ER 20 MEQ PO TBCR: 40.0000 meq | EXTENDED_RELEASE_TABLET | Freq: Two times a day (BID) | ORAL | 1 refills | Status: DC

## 2016-09-28 MED ORDER — MAGNESIUM OXIDE 400 (241.3 MG) MG PO TABS: 400.0000 mg | ORAL_TABLET | Freq: Every day | ORAL | 0 refills | Status: DC

## 2016-09-28 MED ORDER — POLYSACCHARIDE IRON COMPLEX 150 MG PO CAPS: 150.0000 mg | ORAL_CAPSULE | Freq: Every day | ORAL | 0 refills | Status: DC

## 2016-09-28 MED ORDER — IBUPROFEN 600 MG PO TABS: 600.0000 mg | ORAL_TABLET | Freq: Four times a day (QID) | ORAL | 0 refills | Status: DC

## 2016-09-28 NOTE — Lactation Note (Signed)
This note was copied from a baby's chart. Lactation Consultation Note  Patient Name: Marissa [name] Today's Date: 09/28/2016  Mom and baby ready to be discharged.  Mom is breastfeeding with cues and supplementing with a syringe at breast or bottle pc.  She is pumping every 3 hours but not obtaining milk yet.  Reviewed late preterm feeding and amounts needed for supplementation.  Mom has a Spectra pump at home.  Instructed to continue plan as in hospital and when milk is in supplement with her breast milk.  Offered an outpatient appointment but mom states she will call if she feels she needs to be seen.   Maternal Data    Feeding Feeding Type: Formula Length of feed: 20 min  LATCH Score/Interventions                      Lactation Tools Discussed/Used     Consult Status      Ave Filter 09/28/2016, 2:19 PM

## 2016-09-28 NOTE — Progress Notes (Signed)
Dr. Garwin Brothers in the hospital for circumcision of another patient, notified of patient's request to leave early today.   Stated that Lavella Lemons would be here around lunch to discharge the patient.  Parents updated on discharge plan per Dr. Garwin Brothers. Marissa [name] D

## 2016-09-28 NOTE — Progress Notes (Signed)
CSW received consult for hx of Anxiety.  Upon chart review, CSW notes that there is no documentation related to a hx of Anxiety, however, midway through MOB's PNR, Anxiety appears on her problem list without documentation in the narrative supporting it.  CSW met with MOB to offer support and education regarding PMADs.  MOB was appreciative, but denies any hx of mental illness both during her 3 previous postpartum periods as well as in general.  CSW identifies no further need for intervention or barriers to discharge.

## 2016-09-28 NOTE — Progress Notes (Signed)
POSTOPERATIVE DAY # 2 S/P CS  S:         Reports feeling ok - tired / no PIH symptoms             Tolerating po intake / no nausea / no vomiting / + flatus / no BM             Bleeding is light             Pain controlled with motrin mostly             Up ad lib / ambulatory/ voiding QS  Newborn breast feeding   O:  VS: BP 132/69 (BP Location: Right Arm)   Pulse 82   Temp 98.1 F (36.7 C) (Oral)   Resp 18   Ht 5' (1.524 m)   Wt 106.6 kg (235 lb)   LMP 01/18/2016   SpO2 100%   Breastfeeding? Unknown   BMI 45.90 kg/m    BP 132/60 - 136/67 - 145/69 - 123/58   LABS:               Recent Labs  09/27/16 0511 09/27/16 1855  WBC 20.8* 16.4*  HGB 8.2* 8.0*  PLT 161 169               Bloodtype: --/--/B POS (07/03 0440)  Rubella: Immune (01/05 0000)                                             I&O: Intake/Output      07/05 0701 - 07/06 0700 07/06 0701 - 07/07 0700   I.V. (mL/kg)     Total Intake(mL/kg)     Urine (mL/kg/hr) 725 (0.3)    Blood     Total Output 725     Net -725                     Physical Exam:             Alert and Oriented X3  Lungs: Clear and unlabored  Heart: regular rate and rhythm / no mumurs  Abdomen: soft, non-tender, non-distended              Fundus: firm, non-tender, Ueven             Dressing intact              Incision:  approximated with suturen / no erythema / no ecchymosis / no drainage  Perineum: intact  Lochia: light  Extremities: 1+ edema, no calf pain or tenderness, negative Homans  A:        POD # 2 S/P CS            PIH            Presumed chorio - afebrile over 24hr / did not receive last ABX (IV infiltrated)            ABL anemia  P:        Routine postoperative care              [place], [place], MSN, [facility] 09/28/2016, 8:37 AM

## 2016-09-28 NOTE — Progress Notes (Signed)
Patient desires early discharge.  Infant received discharge from Pediatrician.  Notified Artelia Laroche, CNM of mom's discharge home request.  Lavella Lemons to come discharge patient at lunch.  Parents made aware of discharge plan. Karder Goodin D

## 2016-09-28 NOTE — Progress Notes (Signed)
Pt Iv was occluded when RN went to start IV medication at 0330. IV was removed and RN looked for new vein. Pt reports being a hard stick. RN asked several departments for assistance in starting a new IV, but none were available or could get one. RN called CNM on call who said to not give remaining dose as it was the last one and the patient had already had 24 hours of antibiotic coverage

## 2016-09-28 NOTE — Progress Notes (Signed)
Parents complained to Patient Experience and Lactation stating that they were waiting on discharge papers and that they had been seen by the Doctor who said they could go home.  Mother then came to the desk upset, stating that she was in a custody battle and needed to pick up her child by 5pm.  This was the first time this had been stated to the RN today.  RN reassured mother that we had just notified Lavella Lemons of desired discharge and that this RN would call her again letting her know this information.  Artelia Laroche notified of mother's need to leave as soon as possible.  Discharge order to be placed. Danna Sewell D

## 2016-10-03 ENCOUNTER — Other Ambulatory Visit: Payer: Self-pay | Admitting: Obstetrics and Gynecology

## 2016-10-11 NOTE — Discharge Summary (Signed)
POSTOPERATIVE DISCHARGE SUMMARY:  Patient ID: Marissa [name] MRN: [id number] DOB/AGE: 1976/01/16 41 y.o.  Admit date: 09/25/2016 Admission Diagnoses: 36 weeks with PROM  Discharge date: 09/28/2016  Discharge Diagnoses: POD 2 s/p CS  / presumed chorioamnionitis - resolving / PIH-delivered / ABL anemia  Prenatal history: [id number]   EDC : 10/21/2016, by Ultrasound  Prenatal care at [facility]  Primary provider : Maeva Dant Prenatal course complicated by AMA, obesity, previous CS with VBAC x 2, PIH, PROM  Prenatal Labs: ABO, Rh: --/--/B POS (07/03 0440)  Antibody: NEG (07/03 0440) Rubella: Immune (01/05 0000)  RPR: Non Reactive (07/03 0440)  HBsAg: Negative (01/05 0000)  HIV: Non-reactive (01/05 0000)  GBS:     Medical / Surgical History :  Past medical history:  Past Medical History:  Diagnosis Date  . Endometrial polyp   . Endometriosis   . Hypertension     Past surgical history:  Past Surgical History:  Procedure Laterality Date  . CESAREAN SECTION  1995  . CESAREAN SECTION N/A 09/26/2016   Procedure: CESAREAN SECTION;  Surgeon: Servando Salina, MD;  Location: [place];  Service: Obstetrics;  Laterality: N/A;  . HYSTEROSCOPY N/A 04/14/2014   Procedure: HYSTEROSCOPY, SUCTION D&C, RESECTION OF MYOMA;  Surgeon: Governor Specking, MD;  Location: [place];  Service: Gynecology;  Laterality: N/A;  . LAPAROSCOPY N/A 04/14/2014   Procedure: LAPAROSCOPY, EXCISION AND ABLATION OF ENDOMETRIOSIS, CHROMOTUBATION;  Surgeon: Governor Specking, MD;  Location: [place];  Service: Gynecology;  Laterality: N/A;  . TONSILLECTOMY  12-21-2003   12-27-2003--  CAUTERIZATION RIGHT TONSILLAR BLEED POST-OP    Family History:  Family History  Problem Relation Age of Onset  . Hypertension Paternal Grandmother   . Stroke Paternal Grandmother   . Stroke Mother   . Hypertension Mother     Social History:  reports that she has  never smoked. She has never used smokeless tobacco. She reports that she does not drink alcohol or use drugs.  Allergies: Penicillins   Current Medications at time of admission:  Prior to Admission medications   Medication Sig Start Date End Date Taking? Authorizing Provider  Prenatal Vit-Fe Fumarate-FA (PRENATAL MULTIVITAMIN) TABS tablet Take 1 tablet by mouth daily at 12 noon.   Yes [provider]    Intrapartum Course:  Admit for PROM at 64 weeks / [place] labor with labor progression to 6cm dilation with protracted labor curve and febrile with presumed chorioamnionitis Pain management: epidural Complicated by: arrest of dilation and presumed chorioamnionitis / PIH Interventions required: repeat CS  Procedures: Cesarean section delivery on 09/26/2016 with delivery of live female newborn by Dr Garwin Brothers   See operative report for further details APGAR (1 MIN): 9   APGAR (5 MINS): 9    Postoperative / postpartum course:  discharge on POD 2 Complicated by: hypokalemia and dependent edema with PIH and ABL anemia  Discharge Instructions:  Discharged Condition: stable  Activity: pelvic rest and postoperative restrictions x 2   Diet: low sodium  Medications:  Allergies as of 09/28/2016      Reactions   Penicillins Hives   Has patient had a PCN reaction causing immediate rash, facial/tongue/throat swelling, SOB or lightheadedness with hypotension: No Has patient had a PCN reaction causing severe rash involving mucus membranes or skin necrosis: No Has patient had a PCN reaction that required hospitalization: No Has patient had a PCN reaction occurring within the last 10 years: No If all of the above  answers are "NO", then may proceed with Cephalosporin use.      Medication List    STOP taking these medications   IRON PO   ondansetron 4 MG tablet Commonly known as:  ZOFRAN   oxyCODONE-acetaminophen 7.5-325 MG tablet Commonly known as:  PERCOCET     TAKE these  medications   hydrochlorothiazide 25 MG tablet Commonly known as:  HYDRODIURIL Take 1 tablet (25 mg total) by mouth daily.   ibuprofen 600 MG tablet Commonly known as:  ADVIL,MOTRIN Take 1 tablet (600 mg total) by mouth every 6 (six) hours.   iron polysaccharides 150 MG capsule Commonly known as:  NIFEREX Take 1 capsule (150 mg total) by mouth daily.   magnesium oxide 400 (241.3 Mg) MG tablet Commonly known as:  MAG-OX Take 1 tablet (400 mg total) by mouth daily.   potassium chloride SA 20 MEQ tablet Commonly known as:  K-DUR,KLOR-CON Take 2 tablets (40 mEq total) by mouth 2 (two) times daily.   prenatal multivitamin Tabs tablet Take 1 tablet by mouth daily at 12 noon.       Wound Care: keep clean and dry / remove honeycomb POD 5 Postpartum Instructions: [facility] discharge booklet - instructions reviewed  Discharge to: Home  Follow up :  [facility] in 1 week for interval visit with nurse for BP recheck and labs (CMP) [facility] in 6 weeks for routine postpartum visit with 6 weeks                Signed: Artelia Laroche CNM, MSN, [facility] 10/11/2016, 8:53 AM

## 2016-11-08 DIAGNOSIS — Z130 Encounter for screening for diseases of the blood and blood-forming organs and certain disorders involving the immune mechanism: Secondary | ICD-10-CM | POA: Diagnosis not present

## 2016-12-12 DIAGNOSIS — Z23 Encounter for immunization: Secondary | ICD-10-CM | POA: Diagnosis not present

## 2016-12-12 DIAGNOSIS — I10 Essential (primary) hypertension: Secondary | ICD-10-CM | POA: Diagnosis not present

## 2016-12-28 DIAGNOSIS — N921 Excessive and frequent menstruation with irregular cycle: Secondary | ICD-10-CM | POA: Diagnosis not present

## 2017-01-16 DIAGNOSIS — R413 Other amnesia: Secondary | ICD-10-CM | POA: Diagnosis not present

## 2017-01-16 DIAGNOSIS — R42 Dizziness and giddiness: Secondary | ICD-10-CM | POA: Diagnosis not present

## 2017-01-16 DIAGNOSIS — G4489 Other headache syndrome: Secondary | ICD-10-CM | POA: Diagnosis not present

## 2017-01-18 DIAGNOSIS — R413 Other amnesia: Secondary | ICD-10-CM | POA: Diagnosis not present

## 2017-01-18 DIAGNOSIS — R42 Dizziness and giddiness: Secondary | ICD-10-CM | POA: Diagnosis not present

## 2017-01-18 DIAGNOSIS — D509 Iron deficiency anemia, unspecified: Secondary | ICD-10-CM | POA: Diagnosis not present

## 2017-01-18 DIAGNOSIS — R55 Syncope and collapse: Secondary | ICD-10-CM | POA: Diagnosis not present

## 2017-05-27 DIAGNOSIS — E559 Vitamin D deficiency, unspecified: Secondary | ICD-10-CM | POA: Diagnosis not present

## 2017-05-27 DIAGNOSIS — R5383 Other fatigue: Secondary | ICD-10-CM | POA: Diagnosis not present

## 2017-05-29 DIAGNOSIS — R5383 Other fatigue: Secondary | ICD-10-CM | POA: Diagnosis not present

## 2017-05-29 DIAGNOSIS — E559 Vitamin D deficiency, unspecified: Secondary | ICD-10-CM | POA: Diagnosis not present

## 2017-06-28 DIAGNOSIS — N6320 Unspecified lump in the left breast, unspecified quadrant: Secondary | ICD-10-CM | POA: Diagnosis not present

## 2017-07-08 DIAGNOSIS — I10 Essential (primary) hypertension: Secondary | ICD-10-CM | POA: Diagnosis not present

## 2017-07-08 DIAGNOSIS — N6320 Unspecified lump in the left breast, unspecified quadrant: Secondary | ICD-10-CM | POA: Diagnosis not present

## 2017-07-08 DIAGNOSIS — R922 Inconclusive mammogram: Secondary | ICD-10-CM | POA: Diagnosis not present

## 2017-07-10 ENCOUNTER — Other Ambulatory Visit: Payer: Self-pay | Admitting: General Surgery

## 2017-07-10 DIAGNOSIS — I10 Essential (primary) hypertension: Secondary | ICD-10-CM

## 2017-07-15 ENCOUNTER — Other Ambulatory Visit: Payer: Self-pay | Admitting: Radiology

## 2017-07-15 DIAGNOSIS — D242 Benign neoplasm of left breast: Secondary | ICD-10-CM | POA: Diagnosis not present

## 2017-07-17 ENCOUNTER — Other Ambulatory Visit: Payer: 59

## 2017-09-30 DIAGNOSIS — L7211 Pilar cyst: Secondary | ICD-10-CM | POA: Diagnosis not present

## 2017-09-30 DIAGNOSIS — L700 Acne vulgaris: Secondary | ICD-10-CM | POA: Diagnosis not present

## 2017-10-14 DIAGNOSIS — D485 Neoplasm of uncertain behavior of skin: Secondary | ICD-10-CM | POA: Diagnosis not present

## 2017-10-16 DIAGNOSIS — Z713 Dietary counseling and surveillance: Secondary | ICD-10-CM | POA: Diagnosis not present

## 2017-10-29 DIAGNOSIS — D485 Neoplasm of uncertain behavior of skin: Secondary | ICD-10-CM | POA: Diagnosis not present

## 2017-11-04 DIAGNOSIS — L7211 Pilar cyst: Secondary | ICD-10-CM | POA: Diagnosis not present

## 2017-11-18 DIAGNOSIS — Z6841 Body Mass Index (BMI) 40.0 and over, adult: Secondary | ICD-10-CM | POA: Diagnosis not present

## 2017-11-18 DIAGNOSIS — E669 Obesity, unspecified: Secondary | ICD-10-CM | POA: Diagnosis not present

## 2017-11-18 DIAGNOSIS — I10 Essential (primary) hypertension: Secondary | ICD-10-CM | POA: Diagnosis not present

## 2017-12-09 DIAGNOSIS — R21 Rash and other nonspecific skin eruption: Secondary | ICD-10-CM | POA: Diagnosis not present

## 2017-12-09 DIAGNOSIS — Z6841 Body Mass Index (BMI) 40.0 and over, adult: Secondary | ICD-10-CM | POA: Diagnosis not present

## 2017-12-23 DIAGNOSIS — Z23 Encounter for immunization: Secondary | ICD-10-CM | POA: Diagnosis not present

## 2018-01-20 DIAGNOSIS — Z713 Dietary counseling and surveillance: Secondary | ICD-10-CM | POA: Diagnosis not present

## 2018-01-20 DIAGNOSIS — Z6839 Body mass index (BMI) 39.0-39.9, adult: Secondary | ICD-10-CM | POA: Diagnosis not present

## 2018-01-21 ENCOUNTER — Encounter (INDEPENDENT_AMBULATORY_CARE_PROVIDER_SITE_OTHER): Payer: Self-pay

## 2018-01-29 ENCOUNTER — Ambulatory Visit (INDEPENDENT_AMBULATORY_CARE_PROVIDER_SITE_OTHER): Payer: 59 | Admitting: Bariatrics

## 2018-01-29 ENCOUNTER — Encounter (INDEPENDENT_AMBULATORY_CARE_PROVIDER_SITE_OTHER): Payer: Self-pay | Admitting: Bariatrics

## 2018-01-29 VITALS — BP 121/85 | HR 85 | Temp 97.6°F | Ht 61.0 in | Wt 202.0 lb

## 2018-01-29 DIAGNOSIS — R5383 Other fatigue: Secondary | ICD-10-CM | POA: Diagnosis not present

## 2018-01-29 DIAGNOSIS — I10 Essential (primary) hypertension: Secondary | ICD-10-CM

## 2018-01-29 DIAGNOSIS — Z1331 Encounter for screening for depression: Secondary | ICD-10-CM | POA: Diagnosis not present

## 2018-01-29 DIAGNOSIS — E559 Vitamin D deficiency, unspecified: Secondary | ICD-10-CM | POA: Diagnosis not present

## 2018-01-29 DIAGNOSIS — Z9189 Other specified personal risk factors, not elsewhere classified: Secondary | ICD-10-CM

## 2018-01-29 DIAGNOSIS — R0602 Shortness of breath: Secondary | ICD-10-CM

## 2018-01-29 DIAGNOSIS — Z0289 Encounter for other administrative examinations: Secondary | ICD-10-CM

## 2018-01-29 DIAGNOSIS — Z6838 Body mass index (BMI) 38.0-38.9, adult: Secondary | ICD-10-CM

## 2018-01-29 NOTE — Progress Notes (Signed)
.  Office: [phone]  /  Fax: [phone]   HPI:   Chief Complaint: OBESITY  Marissa [name] Marissa [name] (MR# [id number]) is a 42 y.o. female who presents on 01/29/2018 for obesity evaluation and treatment. Current BMI is Body mass index is 38.17 kg/m.Marland Kitchen Marissa [name] Marissa [name] has struggled with obesity for years and has been unsuccessful in either losing weight or maintaining long term weight loss. Marissa [name] Marissa [name] attended our information session and states she is currently in the action stage of change and ready to dedicate time achieving and maintaining a healthier weight.  Marissa [name] Marissa [name] states Marissa [name] Marissa [name] eats meals together she thinks Marissa [name] Marissa [name] will eat healthier with  Marissa [name] Marissa [name] desired weight loss is 53 lbs she has been heavy most of  Marissa [name] life she started gaining weight in high school Marissa [name] heaviest weight ever was 203 lbs. she states she has food cravings with Marissa [name] cycles she snacks frequently in the evenings she skips meals frequently, but it varies as to which meals she is frequently drinking liquids with calories   Marissa [name] Marissa [name] feels Marissa [name] energy is lower than it should be. This has worsened with weight gain and has not worsened recently. Marissa [name] [name] admits to daytime somnolence and she denies waking up still tired. Patient is at risk for obstructive sleep apnea. Marissa [name] has a history of symptoms of daytime Marissa [name] and hypertension. Patient generally gets 6 to 8 hours of sleep per night, and states they generally have  restful sleep. Snoring is not present. Apneic episodes are not present. Epworth Sleepiness Score is 8  Marissa [name] Marissa [name] notes increasing shortness of breath with exercising and seems to be worsening over time with weight gain. She notes getting out of breath sooner depending on the activity (climbing stairs) than she used to. She is not currently exercising.This has not gotten worse recently. Marissa [name] [name] denies orthopnea.  Hypertension Marissa [name] Marissa [name] is a 42 y.o. female with  hypertension.  She is currently taking HCTZ. Marissa [name] Marissa [name] denies lightheadedness. She is working weight loss to help control Marissa [name] blood pressure with the goal of decreasing Marissa [name] risk of heart attack and stroke. Marissa [name] Marissa [name] blood pressure is well controlled.  At risk for cardiovascular disease Marissa [name] [name] is at a higher than average risk for cardiovascular disease due to obesity and hypertension. She currently denies any chest pain.  Vitamin D deficiency Marissa [name] Marissa [name] has a diagnosis of vitamin D deficiency. Marissa [name] Marissa [name] has taken vitamin D in the past, but she is not currently taking vit D. She denies nausea, vomiting or muscle weakness.  Depression Screen Marissa [name] Marissa [name] (modified PHQ-9) score was  Depression screen PHQ 2/9 01/29/2018  Decreased Interest 2  Down, Depressed, Hopeless 1  PHQ - 2 Score 3  Altered sleeping 0  Tired, decreased energy 2  Change in appetite 1  Feeling bad or failure about yourself  1  Trouble concentrating 0  Moving slowly or fidgety/restless 0  PHQ-9 Score 7  Difficult doing work/chores Not difficult at all    ALLERGIES: Allergies  Allergen Reactions  . Penicillins Hives    Has patient had a PCN reaction causing immediate rash, facial/tongue/throat swelling, SOB or lightheadedness with hypotension: No Has patient had a PCN reaction causing severe rash involving mucus membranes or skin necrosis: No Has patient had a PCN reaction that required hospitalization: No Has patient had a PCN reaction occurring within the last 10 years: No If all of the above answers are "NO", then may proceed with Cephalosporin use.  MEDICATIONS: Current Outpatient Medications on File Prior to Visit  Medication Sig Dispense Refill  . hydrochlorothiazide (MICROZIDE) 12.5 MG capsule Take 12.5 mg by mouth daily.    . Multiple Vitamins-Minerals (MULTIVITAMIN WITH MINERALS) tablet Take 1 tablet by mouth daily.    . Vitamins-Lipotropics (COMPLEX B-100 PO) Take by mouth.    .  magnesium oxide (MAG-OX) 400 (241.3 Mg) MG tablet Take 1 tablet (400 mg total) by mouth daily. 45 tablet 0  . Prenatal Vit-Fe Fumarate-FA (PRENATAL MULTIVITAMIN) TABS tablet Take 1 tablet by mouth daily at 12 noon.     No current facility-administered medications on file prior to visit.     PAST MEDICAL HISTORY: Past Medical History:  Diagnosis Date  . Back pain   . Endometrial polyp   . Endometriosis   . Hip pain, bilateral   . Hypertension     PAST SURGICAL HISTORY: Past Surgical History:  Procedure Laterality Date  . CESAREAN SECTION  1995  . CESAREAN SECTION N/A 09/26/2016   Procedure: CESAREAN SECTION;  Surgeon: Servando Salina, MD;  Location: [place];  Service: Obstetrics;  Laterality: N/A;  . HYSTEROSCOPY N/A 04/14/2014   Procedure: HYSTEROSCOPY, SUCTION D&C, RESECTION OF MYOMA;  Surgeon: Governor Specking, MD;  Location: [place];  Service: Gynecology;  Laterality: N/A;  . LAPAROSCOPY N/A 04/14/2014   Procedure: LAPAROSCOPY, EXCISION AND ABLATION OF ENDOMETRIOSIS, CHROMOTUBATION;  Surgeon: Governor Specking, MD;  Location: [place];  Service: Gynecology;  Laterality: N/A;  . TONSILLECTOMY  12-21-2003   12-27-2003--  CAUTERIZATION RIGHT TONSILLAR BLEED POST-OP    SOCIAL HISTORY: Social History   Tobacco Use  . Smoking status: Never Smoker  . Smokeless tobacco: Never Used  Substance Use Topics  . Alcohol use: No  . Drug use: No    [name] HISTORY: [name] History  Problem Relation Age of Onset  . Hypertension Paternal Grandmother   . Stroke Paternal Grandmother   . Stroke Mother   . Hypertension Mother     ROS: Review of Systems  Constitutional: Positive for malaise/Marissa [name].  Respiratory: Positive for shortness of breath (on exertion).   Cardiovascular: Negative for chest pain and orthopnea.  Gastrointestinal: Negative for nausea and vomiting.  Musculoskeletal: Positive for back pain.       Negative for muscle  weakness  Neurological:       Negative for lightheadedness    PHYSICAL EXAM: Blood pressure 121/85, pulse 85, temperature 97.6 F (36.4 C), temperature source Oral, height 5\' 1"  (1.549 m), weight 202 lb (91.6 kg), last menstrual period 01/20/2018, SpO2 100 %, unknown if currently breastfeeding. Body mass index is 38.17 kg/m. Physical Exam  Constitutional: She is oriented to person, place, and time. She appears well-developed and well-nourished.  HENT:  Head: Normocephalic and atraumatic.  Nose: Nose normal.  M[place]anpati = 4  Eyes: EOM are normal. No scleral icterus.  Neck: Normal range of motion. Neck supple. No thyromegaly present.  Cardiovascular: Normal rate and regular rhythm.  Pulmonary/Chest: Effort normal. No respiratory distress.  Abdominal: Soft. There is no tenderness.  + Obesity  Musculoskeletal: Normal range of motion.  Range of Motion normal in all 4 extremities  Neurological: She is alert and oriented to person, place, and time. Coordination normal.  Skin: Skin is warm and dry.  Psychiatric: She has a normal Marissa [name] and affect.  Vitals reviewed.   RECENT LABS AND TESTS: BMET    Component Value Date/Time   NA 136 09/27/2016 1855   K 3.2 (L) 09/27/2016  1855   CL 105 09/27/2016 1855   CO2 25 09/27/2016 1855   GLUCOSE 89 09/27/2016 1855   BUN 10 09/27/2016 1855   CREATININE 0.94 09/27/2016 1855   CREATININE 0.74 05/01/2013 1329   CALCIUM 9.0 09/27/2016 1855   GFRNONAA >60 09/27/2016 1855   GFRAA >60 09/27/2016 1855   No results found for: HGBA1C No results found for: INSULIN CBC    Component Value Date/Time   WBC 16.4 (H) 09/27/2016 1855   RBC 2.80 (L) 09/27/2016 1855   HGB 8.0 (L) 09/27/2016 1855   HCT 23.3 (L) 09/27/2016 1855   PLT 169 09/27/2016 1855   MCV 83.2 09/27/2016 1855   MCH 28.6 09/27/2016 1855   MCHC 34.3 09/27/2016 1855   RDW 15.6 (H) 09/27/2016 1855   LYMPHSABS 2.1 05/01/2013 1329   MONOABS 0.4 05/01/2013 1329   EOSABS 0.2 05/01/2013  1329   BASOSABS 0.1 05/01/2013 1329   Iron/TIBC/Ferritin/ %Sat No results found for: IRON, TIBC, FERRITIN, IRONPCTSAT Lipid Panel     Component Value Date/Time   CHOL 178 05/01/2013 1329   TRIG 59 05/01/2013 1329   HDL 52 05/01/2013 1329   LDLDIRECT 114 (H) 05/01/2013 1329   Hepatic Function Panel     Component Value Date/Time   PROT 5.6 (L) 09/27/2016 1855   ALBUMIN 2.2 (L) 09/27/2016 1855   AST 38 09/27/2016 1855   ALT 24 09/27/2016 1855   ALKPHOS 102 09/27/2016 1855   BILITOT 0.4 09/27/2016 1855      Component Value Date/Time   TSH 1.420 05/01/2013 1329   Vitamin D There are no recent lab results  ECG  shows NSR with a rate of 83 BPM INDIRECT CALORIMETER done today shows a VO2 of 206 and a REE of 1431. Marissa [name] calculated basal metabolic rate is 4259 thus Marissa [name] basal metabolic rate is worse than expected.    ASSESSMENT AND PLAN: Other Marissa [name] - Plan: EKG 12-Lead, CBC With Differential, Vitamin B12, Comprehensive metabolic panel, Folate, Hemoglobin A1c, Insulin, random, Lipid Panel With LDL/HDL Ratio, T3, T4, free, TSH  Shortness of breath on exertion - Plan: CBC With Differential  Essential hypertension - Plan: Comprehensive metabolic panel  Vitamin D deficiency - Plan: VITAMIN D 25 Hydroxy (Vit-D Deficiency, Fractures)  Depression screening  At risk for heart disease  Class 2 severe obesity with serious comorbidity and body mass index (BMI) of 38.0 to 38.9 in adult, unspecified obesity type (HCC)  PLAN:  Marissa [name] Marissa [name] Marissa [name] was informed that Marissa [name] Marissa [name] may be related to obesity, depression or many other causes. Labs will be ordered, and in the meanwhile Icelynn has agreed to work on diet, exercise and weight loss to help with Marissa [name]. Proper sleep hygiene was discussed including the need for 7-8 hours of quality sleep each night. A sleep study was not ordered based on symptoms and Epworth score.  Marissa [name] on exertion Siennah's shortness of breath appears to be obesity  related and exercise induced. She has agreed to work on weight loss and slowly increase exercise over time to treat Marissa [name] exercise induced shortness of breath. If Kayci follows our instructions and loses weight without improvement of Marissa [name] shortness of breath, we will plan to refer to pulmonology. We will monitor this condition regularly. Kharma agrees to this plan.  Hypertension We discussed sodium restriction, working on healthy weight loss, and a regular exercise program as the means to achieve improved blood pressure control. Marzell agreed with this plan and agreed to follow up as directed. We will continue  to monitor Marissa [name] blood pressure as well as Marissa [name] progress with the above lifestyle modifications. She will continue Marissa [name] medications as prescribed and will watch for signs of hypotension as she continues Marissa [name] lifestyle modifications.  Cardiovascular risk counseling Aileana was given extended (15 minutes) coronary artery disease prevention counseling today. She is 42 y.o. female and has risk factors for heart disease including obesity and hypertension. We discussed intensive lifestyle modifications today with an emphasis on specific weight loss instructions and strategies. Pt was also informed of the importance of increasing exercise and decreasing saturated fats to help prevent heart disease.  Vitamin D Deficiency Erik was informed that low vitamin D levels contributes to Marissa [name] and are associated with obesity, breast, and colon cancer. We will check vitamin D level today and she will follow up for routine testing of vitamin D, at least 2-3 times per year.  Depression Screen Alivia had a mildly positive depression screening. Depression is commonly associated with obesity and often results in emotional eating behaviors. We will monitor this closely and work on CBT to help improve the non-hunger eating patterns. Referral to Psychology may be required if no improvement is seen as she continues in our  clinic.  Obesity Aliahna is currently in the action stage of change and Marissa [name] goal is to continue with weight loss efforts She has agreed to follow the Category 2 plan Irys has been instructed to work up to a goal of 150 minutes of combined cardio and strengthening exercise per week for weight loss and overall health benefits. We discussed the following Behavioral Modification Strategies today: increase H2O intake, stop skipping meals, increase meal planning, increasing lean protein intake, decreasing simple carbohydrates , increasing vegetables, decreasing sodium intake and decrease eating out  Lisbeth has agreed to follow up with our clinic in 2 weeks. She was informed of the importance of frequent follow up visits to maximize Marissa [name] success with intensive lifestyle modifications for Marissa [name] multiple health conditions. She was informed we would discuss Marissa [name] lab results at Marissa [name] next visit unless there is a critical issue that needs to be addressed sooner. Katilin agreed to keep Marissa [name] next visit at the agreed upon time to discuss these results.    OBESITY BEHAVIORAL INTERVENTION VISIT  Today's visit was # 1   Starting weight: 202 lbs Starting date: 01/29/18 Today's weight : 202 lbs  Today's date: 01/29/2018 Total lbs lost to date: 0    ASK: We discussed the diagnosis of obesity with Leanord Hawking today and Louisa Second agreed to give [place] permission to discuss obesity behavioral modification therapy today.  ASSESS: Xinyi has the diagnosis of obesity and Marissa [name] BMI today is 38.19 Milta is in the action stage of change   ADVISE: Verner was educated on the multiple health risks of obesity as well as the benefit of weight loss to improve Marissa [name] health. She was advised of the need for long term treatment and the importance of lifestyle modifications to improve Marissa [name] current health and to decrease Marissa [name] risk of future health problems.  AGREE: Multiple dietary modification options and treatment options  were discussed and  Barbarann agreed to follow the recommendations documented in the above note.  ARRANGE: Aeva was educated on the importance of frequent visits to treat obesity as outlined per CMS and USPSTF guidelines and agreed to schedule Marissa [name] next follow up appointment today.   Corey Skains, am acting as Location manager for [employer]. Owens Shark, DO  I have reviewed the above documentation for accuracy  and completeness, and I agree with the above. -Jearld Lesch, DO

## 2018-01-30 LAB — COMPREHENSIVE METABOLIC PANEL
A/G RATIO: 1.3 (ref 1.2–2.2)
ALBUMIN: 4.3 g/dL (ref 3.5–5.5)
ALK PHOS: 114 IU/L (ref 39–117)
ALT: 19 IU/L (ref 0–32)
AST: 15 IU/L (ref 0–40)
BILIRUBIN TOTAL: 0.4 mg/dL (ref 0.0–1.2)
BUN / CREAT RATIO: 12 (ref 9–23)
BUN: 10 mg/dL (ref 6–24)
CHLORIDE: 95 mmol/L — AB (ref 96–106)
CO2: 26 mmol/L (ref 20–29)
Calcium: 9.5 mg/dL (ref 8.7–10.2)
Creatinine, Ser: 0.84 mg/dL (ref 0.57–1.00)
GFR calc non Af Amer: 87 mL/min/{1.73_m2} (ref >59)
GFR, EST AFRICAN AMERICAN: 101 mL/min/{1.73_m2} (ref >59)
GLOBULIN, TOTAL: 3.2 g/dL (ref 1.5–4.5)
GLUCOSE: 75 mg/dL (ref 65–99)
POTASSIUM: 3.7 mmol/L (ref 3.5–5.2)
SODIUM: 137 mmol/L (ref 134–144)
TOTAL PROTEIN: 7.5 g/dL (ref 6.0–8.5)

## 2018-01-30 LAB — CBC WITH DIFFERENTIAL
Basophils Absolute: 0.1 10*3/uL (ref 0.0–0.2)
Basos: 1 %
EOS (ABSOLUTE): 0.3 10*3/uL (ref 0.0–0.4)
EOS: 3 %
HEMATOCRIT: 42.8 % (ref 34.0–46.6)
HEMOGLOBIN: 14.1 g/dL (ref 11.1–15.9)
IMMATURE GRANULOCYTES: 0 %
Immature Grans (Abs): 0 10*3/uL (ref 0.0–0.1)
LYMPHS ABS: 2.3 10*3/uL (ref 0.7–3.1)
Lymphs: 24 %
MCH: 28.3 pg (ref 26.6–33.0)
MCHC: 32.9 g/dL (ref 31.5–35.7)
MCV: 86 fL (ref 79–97)
MONOCYTES: 6 %
Monocytes Absolute: 0.6 10*3/uL (ref 0.1–0.9)
NEUTROS ABS: 6.5 10*3/uL (ref 1.4–7.0)
Neutrophils: 66 %
RBC: 4.98 x10E6/uL (ref 3.77–5.28)
RDW: 13.3 % (ref 12.3–15.4)
WBC: 9.7 10*3/uL (ref 3.4–10.8)

## 2018-01-30 LAB — INSULIN, RANDOM: INSULIN: 9.1 u[IU]/mL (ref 2.6–24.9)

## 2018-01-30 LAB — LIPID PANEL WITH LDL/HDL RATIO
CHOLESTEROL TOTAL: 179 mg/dL (ref 100–199)
HDL: 44 mg/dL (ref >39)
LDL Calculated: 121 mg/dL — ABNORMAL HIGH (ref 0–99)
LDl/HDL Ratio: 2.8 ratio (ref 0.0–3.2)
Triglycerides: 70 mg/dL (ref 0–149)
VLDL Cholesterol Cal: 14 mg/dL (ref 5–40)

## 2018-01-30 LAB — T3: T3 TOTAL: 142 ng/dL (ref 71–180)

## 2018-01-30 LAB — T4, FREE: Free T4: 1.36 ng/dL (ref 0.82–1.77)

## 2018-01-30 LAB — VITAMIN D 25 HYDROXY (VIT D DEFICIENCY, FRACTURES): Vit D, 25-Hydroxy: 45.5 ng/mL (ref 30.0–100.0)

## 2018-01-30 LAB — TSH: TSH: 2.51 u[IU]/mL (ref 0.450–4.500)

## 2018-01-30 LAB — VITAMIN B12: Vitamin B-12: 1495 pg/mL — ABNORMAL HIGH (ref 232–1245)

## 2018-01-30 LAB — HEMOGLOBIN A1C
Est. average glucose Bld gHb Est-mCnc: 108 mg/dL
Hgb A1c MFr Bld: 5.4 % (ref 4.8–5.6)

## 2018-01-30 LAB — FOLATE: Folate: >20 ng/mL (ref >3.0)

## 2018-02-11 ENCOUNTER — Ambulatory Visit (INDEPENDENT_AMBULATORY_CARE_PROVIDER_SITE_OTHER): Payer: 59 | Admitting: Bariatrics

## 2018-02-11 ENCOUNTER — Encounter (INDEPENDENT_AMBULATORY_CARE_PROVIDER_SITE_OTHER): Payer: Self-pay | Admitting: Bariatrics

## 2018-02-11 VITALS — BP 122/88 | HR 89 | Temp 98.6°F | Ht 61.0 in | Wt 198.0 lb

## 2018-02-11 DIAGNOSIS — I10 Essential (primary) hypertension: Secondary | ICD-10-CM | POA: Diagnosis not present

## 2018-02-11 DIAGNOSIS — E8881 Metabolic syndrome: Secondary | ICD-10-CM | POA: Diagnosis not present

## 2018-02-11 DIAGNOSIS — Z6837 Body mass index (BMI) 37.0-37.9, adult: Secondary | ICD-10-CM | POA: Diagnosis not present

## 2018-02-12 ENCOUNTER — Ambulatory Visit (INDEPENDENT_AMBULATORY_CARE_PROVIDER_SITE_OTHER): Payer: Self-pay | Admitting: Bariatrics

## 2018-02-17 DIAGNOSIS — Z6837 Body mass index (BMI) 37.0-37.9, adult: Secondary | ICD-10-CM

## 2018-02-17 DIAGNOSIS — E8881 Metabolic syndrome: Secondary | ICD-10-CM | POA: Insufficient documentation

## 2018-02-17 DIAGNOSIS — E66812 Obesity, class 2: Secondary | ICD-10-CM | POA: Insufficient documentation

## 2018-02-17 NOTE — Progress Notes (Signed)
Office: [phone]  /  Fax: [phone]   HPI:   Chief Complaint: OBESITY [name] is here to discuss her progress with her obesity treatment plan. She is on the Category 2 plan and is following her eating plan approximately 80 % of the time. She states she is exercising 0 minutes 0 times per week. [name] is currently struggling with bread, but she did not struggle with hunger.  Her weight is 198 lb (89.8 kg) today and has had a weight loss of 4 pounds over a period of 2 weeks since her last visit. She has lost 4 lbs since starting treatment with [place].  Hypertension [name] is a 42 y.o. female with hypertension. She is working on weight loss to help control her blood pressure with the goal of decreasing her risk of heart attack and stroke. [name]'s blood pressure is currently well controlled and she is taking HCTZ 12.5mg . [name] denies chest pain or headaches.  Insulin Resistance, Mild [name] has a diagnosis of insulin resistance based on her elevated fasting insulin level >5. Although [name]'s blood glucose readings are still under good control, insulin resistance puts her at greater risk of metabolic syndrome and diabetes. Her A1c was 5.4 and Insulin was 9.1 on 01/29/18. She is not taking medications currently and continues to work on diet and exercise to decrease risk of diabetes. She denies polyphagia.  ALLERGIES: Allergies  Allergen Reactions  . Penicillins Hives    Has patient had a PCN reaction causing immediate rash, facial/tongue/throat swelling, SOB or lightheadedness with hypotension: No Has patient had a PCN reaction causing severe rash involving mucus membranes or skin necrosis: No Has patient had a PCN reaction that required hospitalization: No Has patient had a PCN reaction occurring within the last 10 years: No If all of the above answers are "NO", then may proceed with Cephalosporin use.     MEDICATIONS: Current Outpatient Medications on File Prior  to Visit  Medication Sig Dispense Refill  . hydrochlorothiazide (MICROZIDE) 12.5 MG capsule Take 12.5 mg by mouth daily.    . magnesium oxide (MAG-OX) 400 (241.3 Mg) MG tablet Take 1 tablet (400 mg total) by mouth daily. 45 tablet 0  . Multiple Vitamins-Minerals (MULTIVITAMIN WITH MINERALS) tablet Take 1 tablet by mouth daily.    . Prenatal Vit-Fe Fumarate-FA (PRENATAL MULTIVITAMIN) TABS tablet Take 1 tablet by mouth daily at 12 noon.    . Vitamins-Lipotropics (COMPLEX B-100 PO) Take by mouth.     No current facility-administered medications on file prior to visit.     PAST MEDICAL HISTORY: Past Medical History:  Diagnosis Date  . Back pain   . Endometrial polyp   . Endometriosis   . Hip pain, bilateral   . Hypertension     PAST SURGICAL HISTORY: Past Surgical History:  Procedure Laterality Date  . CESAREAN SECTION  1995  . CESAREAN SECTION N/A 09/26/2016   Procedure: CESAREAN SECTION;  Surgeon: Servando Salina, MD;  Location: [place];  Service: Obstetrics;  Laterality: N/A;  . HYSTEROSCOPY N/A 04/14/2014   Procedure: HYSTEROSCOPY, SUCTION D&C, RESECTION OF MYOMA;  Surgeon: Governor Specking, MD;  Location: [place];  Service: Gynecology;  Laterality: N/A;  . LAPAROSCOPY N/A 04/14/2014   Procedure: LAPAROSCOPY, EXCISION AND ABLATION OF ENDOMETRIOSIS, CHROMOTUBATION;  Surgeon: Governor Specking, MD;  Location: [place];  Service: Gynecology;  Laterality: N/A;  . TONSILLECTOMY  12-21-2003   12-27-2003--  CAUTERIZATION RIGHT TONSILLAR BLEED POST-OP    SOCIAL HISTORY:  Social History   Tobacco Use  . Smoking status: Never Smoker  . Smokeless tobacco: Never Used  Substance Use Topics  . Alcohol use: No  . Drug use: No    FAMILY HISTORY: Family History  Problem Relation Age of Onset  . Hypertension Paternal Grandmother   . Stroke Paternal Grandmother   . Stroke Mother   . Hypertension Mother     ROS: Review of Systems    Constitutional: Positive for weight loss.  Cardiovascular: Negative for chest pain.  Neurological: Negative for headaches.  Endo/Heme/Allergies:       Negative for polyphagia.    PHYSICAL EXAM: Blood pressure 122/88, pulse 89, temperature 98.6 F (37 C), temperature source Oral, height 5\' 1"  (1.549 m), weight 198 lb (89.8 kg), last menstrual period 01/20/2018, SpO2 98 %, unknown if currently breastfeeding. Body mass index is 37.41 kg/m. Physical Exam  Constitutional: She is oriented to person, place, and time. She appears well-developed and well-nourished.  Cardiovascular: Normal rate.  Pulmonary/Chest: Effort normal.  Musculoskeletal: Normal range of motion.  Neurological: She is oriented to person, place, and time.  Skin: Skin is warm and dry.  Psychiatric: She has a normal mood and affect. Her behavior is normal.  Vitals reviewed.   RECENT LABS AND TESTS: BMET    Component Value Date/Time   NA 137 01/29/2018 0930   K 3.7 01/29/2018 0930   CL 95 (L) 01/29/2018 0930   CO2 26 01/29/2018 0930   GLUCOSE 75 01/29/2018 0930   GLUCOSE 89 09/27/2016 1855   BUN 10 01/29/2018 0930   CREATININE 0.84 01/29/2018 0930   CREATININE 0.74 05/01/2013 1329   CALCIUM 9.5 01/29/2018 0930   GFRNONAA 87 01/29/2018 0930   GFRAA 101 01/29/2018 0930   Lab Results  Component Value Date   HGBA1C 5.4 01/29/2018   Lab Results  Component Value Date   INSULIN 9.1 01/29/2018   CBC    Component Value Date/Time   WBC 9.7 01/29/2018 0930   WBC 16.4 (H) 09/27/2016 1855   RBC 4.98 01/29/2018 0930   RBC 2.80 (L) 09/27/2016 1855   HGB 14.1 01/29/2018 0930   HCT 42.8 01/29/2018 0930   PLT 169 09/27/2016 1855   MCV 86 01/29/2018 0930   MCH 28.3 01/29/2018 0930   MCH 28.6 09/27/2016 1855   MCHC 32.9 01/29/2018 0930   MCHC 34.3 09/27/2016 1855   RDW 13.3 01/29/2018 0930   LYMPHSABS 2.3 01/29/2018 0930   MONOABS 0.4 05/01/2013 1329   EOSABS 0.3 01/29/2018 0930   BASOSABS 0.1 01/29/2018 0930    Iron/TIBC/Ferritin/ %Sat No results found for: IRON, TIBC, FERRITIN, IRONPCTSAT Lipid Panel     Component Value Date/Time   CHOL 179 01/29/2018 0930   TRIG 70 01/29/2018 0930   HDL 44 01/29/2018 0930   LDLCALC 121 (H) 01/29/2018 0930   LDLDIRECT 114 (H) 05/01/2013 1329   Hepatic Function Panel     Component Value Date/Time   PROT 7.5 01/29/2018 0930   ALBUMIN 4.3 01/29/2018 0930   AST 15 01/29/2018 0930   ALT 19 01/29/2018 0930   ALKPHOS 114 01/29/2018 0930   BILITOT 0.4 01/29/2018 0930      Component Value Date/Time   TSH 2.510 01/29/2018 0930   TSH 1.420 05/01/2013 1329   Results for MOMOKA, STRINGFIELD (MRN [id number]) as of 02/17/2018 16:33  Ref. Range 01/29/2018 09:30  Vitamin D, 25-Hydroxy Latest Ref Range: 30.0 - 100.0 ng/mL 45.5   ASSESSMENT AND PLAN: Essential hypertension  Insulin  resistance  Class 2 severe obesity with serious comorbidity and body mass index (BMI) of 37.0 to 37.9 in adult, unspecified obesity type ([place])  PLAN:  Hypertension We discussed sodium restriction, working on healthy weight loss, and a regular exercise program as the means to achieve improved blood pressure control. We will continue to monitor her blood pressure as well as her progress with the above lifestyle modifications. She agrees to continue taking HCTZ as prescribed and will not add any salt to her diet. She will watch for signs of hypotension as she continues her lifestyle modifications. Anabia agreed with this plan and agreed to follow up as directed in 2 weeks.  Insulin Resistance, Mild Juanette will continue to work on weight loss, exercise, and decreasing simple carbohydrates in her diet to help decrease the risk of diabetes. We discussed metformin including benefits and risks. She was informed that eating too many simple carbohydrates or too many calories at one sitting increases the likelihood of GI side effects. Insulin resistance was discussed per myself and our  registered dietitian and no medications were needed. Jaquanda agreed to follow up with [place] as directed to monitor her progress.  I spent > than 50% of the 15 minute visit on counseling as documented in the note.  Obesity Kaitland is currently in the action stage of change. As such, her goal is to continue with weight loss efforts. She has agreed to follow the Category 2 plan. She was given the "Thanksgiving" and "Holiday Sheet". Mechille has been instructed to work up to a goal of 150 minutes of combined cardio and strengthening exercise per week for weight loss and overall health benefits. We discussed the following Behavioral Modification Strategies today: increasing lean protein intake, decreasing simple carbohydrates, increasing vegetables, increase H2O intake, decrease eating out, no skipping meals, work on meal planning and easy cooking plans, holiday eating strategies, and celebration eating strategies.   Amaira has agreed to follow up with our clinic in 2 weeks. She was informed of the importance of frequent follow up visits to maximize her success with intensive lifestyle modifications for her multiple health conditions.   OBESITY BEHAVIORAL INTERVENTION VISIT  Today's visit was # 2   Starting weight: 202 lbs Starting date: 02/08/18 Today's weight : Weight: 198 lb (89.8 kg)  Today's date: 02/11/2018 Total lbs lost to date: 4  ASK: We discussed the diagnosis of obesity with Opelika today and Louisa Second agreed to give [place] permission to discuss obesity behavioral modification therapy today.  ASSESS: Layann has the diagnosis of obesity and her BMI today is 37.43. Tariah is in the action stage of change.   ADVISE: Thessaly was educated on the multiple health risks of obesity as well as the benefit of weight loss to improve her health. She was advised of the need for long term treatment and the importance of lifestyle modifications to improve her current health and to  decrease her risk of future health problems.  AGREE: Multiple dietary modification options and treatment options were discussed and Jaelene agreed to follow the recommendations documented in the above note.  ARRANGE: Bryonna was educated on the importance of frequent visits to treat obesity as outlined per CMS and USPSTF guidelines and agreed to schedule her next follow up appointment today.  I, Marcille Blanco, am acting as Location manager for [employer]. Owens Shark, DO  I have reviewed the above documentation for accuracy and completeness, and I agree with the above. -Jearld Lesch, DO

## 2018-02-25 ENCOUNTER — Encounter (INDEPENDENT_AMBULATORY_CARE_PROVIDER_SITE_OTHER): Payer: Self-pay | Admitting: Bariatrics

## 2018-02-25 ENCOUNTER — Ambulatory Visit (INDEPENDENT_AMBULATORY_CARE_PROVIDER_SITE_OTHER): Payer: 59 | Admitting: Bariatrics

## 2018-02-25 VITALS — BP 126/89 | HR 87 | Temp 98.0°F | Ht 61.0 in | Wt 200.0 lb

## 2018-02-25 DIAGNOSIS — K5909 Other constipation: Secondary | ICD-10-CM | POA: Diagnosis not present

## 2018-02-25 DIAGNOSIS — Z6837 Body mass index (BMI) 37.0-37.9, adult: Secondary | ICD-10-CM

## 2018-02-25 DIAGNOSIS — I10 Essential (primary) hypertension: Secondary | ICD-10-CM | POA: Diagnosis not present

## 2018-02-25 DIAGNOSIS — Z9189 Other specified personal risk factors, not elsewhere classified: Secondary | ICD-10-CM

## 2018-02-25 DIAGNOSIS — E8881 Metabolic syndrome: Secondary | ICD-10-CM | POA: Diagnosis not present

## 2018-02-25 MED ORDER — POLYETHYLENE GLYCOL 3350 17 GM/SCOOP PO POWD: 17.0000 g | Freq: Every day | ORAL | 0 refills | Status: DC

## 2018-02-27 NOTE — Progress Notes (Signed)
Office: [phone]  /  Fax: [phone]   HPI:   Chief Complaint: OBESITY Marissa [name] is here to discuss her progress with her obesity treatment plan. She is on the Category 2 plan and is following her eating plan approximately 50 % of the time. She states she is exercising 0 minutes 0 times per week. Marissa [name] struggled some during the holidays. She also has been traveling.  Her weight is 200 lb (90.7 kg) today and has gained 2 pounds since her last visit. She has lost 2 lbs since starting treatment with [place].  Hypertension Marissa [name] is a 42 y.o. female with hypertension. Marissa [name] is taking hydrochlorothiazide. Her blood pressure is reasonably controlled and she denies chest pain. She is working weight loss to help control her blood pressure with the goal of decreasing her risk of heart attack and stroke.   At risk for cardiovascular disease Marissa [name] is at a higher than average risk for cardiovascular disease due to obesity and hypertension. She currently denies any chest pain.  Insulin Resistance Marissa [name] has a diagnosis of insulin resistance based on her elevated fasting insulin level at 9.1 and Hgb A1c at 5.4. Although Marissa [name]'s blood glucose readings are still under good control, insulin resistance puts her at greater risk of metabolic syndrome and diabetes. She is not taking metformin currently and continues to work on diet and exercise to decrease risk of diabetes.  Constipation Marissa [name] notes constipation for the last few weeks, worse since attempting weight loss. She states BM are less frequent and are hard and painful. She denies hematochezia or melena. She admits to drinking less H20 recently.  ALLERGIES: Allergies  Allergen Reactions  . Penicillins Hives    Has patient had a PCN reaction causing immediate rash, facial/tongue/throat swelling, SOB or lightheadedness with hypotension: No Has patient had a PCN reaction causing severe rash involving mucus membranes or skin  necrosis: No Has patient had a PCN reaction that required hospitalization: No Has patient had a PCN reaction occurring within the last 10 years: No If all of the above answers are "NO", then may proceed with Cephalosporin use.     MEDICATIONS: Current Outpatient Medications on File Prior to Visit  Medication Sig Dispense Refill  . hydrochlorothiazide (MICROZIDE) 12.5 MG capsule Take 12.5 mg by mouth daily.    . magnesium oxide (MAG-OX) 400 (241.3 Mg) MG tablet Take 1 tablet (400 mg total) by mouth daily. 45 tablet 0  . Multiple Vitamins-Minerals (MULTIVITAMIN WITH MINERALS) tablet Take 1 tablet by mouth daily.    . Prenatal Vit-Fe Fumarate-FA (PRENATAL MULTIVITAMIN) TABS tablet Take 1 tablet by mouth daily at 12 noon.    . Vitamins-Lipotropics (COMPLEX B-100 PO) Take by mouth.     No current facility-administered medications on file prior to visit.     PAST MEDICAL HISTORY: Past Medical History:  Diagnosis Date  . Back pain   . Endometrial polyp   . Endometriosis   . Hip pain, bilateral   . Hypertension     PAST SURGICAL HISTORY: Past Surgical History:  Procedure Laterality Date  . CESAREAN SECTION  1995  . CESAREAN SECTION N/A 09/26/2016   Procedure: CESAREAN SECTION;  Surgeon: Servando Salina, MD;  Location: [place];  Service: Obstetrics;  Laterality: N/A;  . HYSTEROSCOPY N/A 04/14/2014   Procedure: HYSTEROSCOPY, SUCTION D&C, RESECTION OF MYOMA;  Surgeon: Governor Specking, MD;  Location: [place];  Service: Gynecology;  Laterality: N/A;  . LAPAROSCOPY N/A 04/14/2014   Procedure: LAPAROSCOPY,  EXCISION AND ABLATION OF ENDOMETRIOSIS, CHROMOTUBATION;  Surgeon: Governor Specking, MD;  Location: [place];  Service: Gynecology;  Laterality: N/A;  . TONSILLECTOMY  12-21-2003   12-27-2003--  CAUTERIZATION RIGHT TONSILLAR BLEED POST-OP    SOCIAL HISTORY: Social History   Tobacco Use  . Smoking status: Never Smoker  . Smokeless  tobacco: Never Used  Substance Use Topics  . Alcohol use: No  . Drug use: No    FAMILY HISTORY: Family History  Problem Relation Age of Onset  . Hypertension Paternal Grandmother   . Stroke Paternal Grandmother   . Stroke Mother   . Hypertension Mother     ROS: Review of Systems  Constitutional: Negative for weight loss.  Cardiovascular: Negative for chest pain.  Gastrointestinal: Positive for constipation. Negative for melena.       Negative hematochezia    PHYSICAL EXAM: Blood pressure 126/89, pulse 87, temperature 98 F (36.7 C), temperature source Oral, height 5\' 1"  (1.549 m), weight 200 lb (90.7 kg), SpO2 97 %, unknown if currently breastfeeding. Body mass index is 37.79 kg/m. Physical Exam  Constitutional: She is oriented to person, place, and time. She appears well-developed and well-nourished.  Cardiovascular: Normal rate.  Pulmonary/Chest: Effort normal.  Musculoskeletal: Normal range of motion.  Neurological: She is oriented to person, place, and time.  Skin: Skin is warm and dry.  Psychiatric: She has a normal mood and affect. Her behavior is normal.  Vitals reviewed.   RECENT LABS AND TESTS: BMET    Component Value Date/Time   NA 137 01/29/2018 0930   K 3.7 01/29/2018 0930   CL 95 (L) 01/29/2018 0930   CO2 26 01/29/2018 0930   GLUCOSE 75 01/29/2018 0930   GLUCOSE 89 09/27/2016 1855   BUN 10 01/29/2018 0930   CREATININE 0.84 01/29/2018 0930   CREATININE 0.74 05/01/2013 1329   CALCIUM 9.5 01/29/2018 0930   GFRNONAA 87 01/29/2018 0930   GFRAA 101 01/29/2018 0930   Lab Results  Component Value Date   HGBA1C 5.4 01/29/2018   Lab Results  Component Value Date   INSULIN 9.1 01/29/2018   CBC    Component Value Date/Time   WBC 9.7 01/29/2018 0930   WBC 16.4 (H) 09/27/2016 1855   RBC 4.98 01/29/2018 0930   RBC 2.80 (L) 09/27/2016 1855   HGB 14.1 01/29/2018 0930   HCT 42.8 01/29/2018 0930   PLT 169 09/27/2016 1855   MCV 86 01/29/2018 0930    MCH 28.3 01/29/2018 0930   MCH 28.6 09/27/2016 1855   MCHC 32.9 01/29/2018 0930   MCHC 34.3 09/27/2016 1855   RDW 13.3 01/29/2018 0930   LYMPHSABS 2.3 01/29/2018 0930   MONOABS 0.4 05/01/2013 1329   EOSABS 0.3 01/29/2018 0930   BASOSABS 0.1 01/29/2018 0930   Iron/TIBC/Ferritin/ %Sat No results found for: IRON, TIBC, FERRITIN, IRONPCTSAT Lipid Panel     Component Value Date/Time   CHOL 179 01/29/2018 0930   TRIG 70 01/29/2018 0930   HDL 44 01/29/2018 0930   LDLCALC 121 (H) 01/29/2018 0930   LDLDIRECT 114 (H) 05/01/2013 1329   Hepatic Function Panel     Component Value Date/Time   PROT 7.5 01/29/2018 0930   ALBUMIN 4.3 01/29/2018 0930   AST 15 01/29/2018 0930   ALT 19 01/29/2018 0930   ALKPHOS 114 01/29/2018 0930   BILITOT 0.4 01/29/2018 0930      Component Value Date/Time   TSH 2.510 01/29/2018 0930   TSH 1.420 05/01/2013 1329  ASSESSMENT AND PLAN: Essential hypertension  Insulin resistance  Other constipation - Plan: polyethylene glycol powder (GLYCOLAX/MIRALAX) powder  At risk for heart disease  Class 2 severe obesity with serious comorbidity and body mass index (BMI) of 37.0 to 37.9 in adult, unspecified obesity type (Marissa [name])  PLAN:  Hypertension We discussed sodium restriction, working on healthy weight loss, and a regular exercise program as the means to achieve improved blood pressure control. Marissa [name] agreed with this plan and agreed to follow up as directed. We will continue to monitor her blood pressure as well as her progress with the above lifestyle modifications. Marissa [name] agrees to continue her medications and will watch for signs of hypotension as she continues her lifestyle modifications. Marissa [name] agrees to follow up with our clinic in 2 weeks.  Cardiovascular risk counselling Marissa [name] was given extended (15 minutes) coronary artery disease prevention counseling today. She is 42 y.o. female and has risk factors for heart disease including obesity and  hypertension. We discussed intensive lifestyle modifications today with an emphasis on specific weight loss instructions and strategies. Pt was also informed of the importance of increasing exercise and decreasing saturated fats to help prevent heart disease.  Insulin Resistance Marissa [name] will continue to work on weight loss, diet, exercise, increasing protein, and decreasing simple carbohydrates in her diet to help decrease the risk of diabetes. We dicussed metformin including benefits and risks. She was informed that eating too many simple carbohydrates or too many calories at one sitting increases the likelihood of GI side effects. Marissa [name] declined metformin for now and prescription was not written today. Marissa [name] agrees to follow up with our clinic in 2 weeks as directed to monitor her progress.  Constipation Marissa [name] was informed decrease bowel movement frequency is normal while losing weight, but stools should not be hard or painful. She was advised to increase her H20 intake and work on increasing her fiber intake. High fiber foods were discussed today. Marissa [name] is to consider enema and she agrees to start miralax OTC daily, and she agrees to follow up with our clinic in 2 weeks.  Obesity Marissa [name] is currently in the action stage of change. As such, her goal is to continue with weight loss efforts She has agreed to follow the Category 2 plan Marissa [name] has been instructed to work up to a goal of 150 minutes of combined cardio and strengthening exercise per week for weight loss and overall health benefits. We discussed the following Behavioral Modification Strategies today: increasing lean protein intake, decreasing simple carbohydrates, increasing vegetables, increase H20 intake, and work on meal planning and easy cooking plans Marissa [name] can substitute for rice and decrease bread per patient.  Marissa [name] has agreed to follow up with our clinic in 2 weeks. She was informed of the importance of frequent follow up  visits to maximize her success with intensive lifestyle modifications for her multiple health conditions.   OBESITY BEHAVIORAL INTERVENTION VISIT  Today's visit was # 3   Starting weight: 202 lbs Starting date: 01/29/18 Today's weight : 200 lbs Today's date: 02/25/2018 Total lbs lost to date: 2    ASK: We discussed the diagnosis of obesity with Marissa [name] today and Marissa [name] agreed to give [place] permission to discuss obesity behavioral modification therapy today.  ASSESS: Marissa [name] has the diagnosis of obesity and her BMI today is 37.81 Hermela is in the action stage of change   ADVISE: Andree was educated on the multiple health risks of obesity as well as the benefit of weight  loss to improve her health. She was advised of the need for long term treatment and the importance of lifestyle modifications to improve her current health and to decrease her risk of future health problems.  AGREE: Multiple dietary modification options and treatment options were discussed and  Rojean agreed to follow the recommendations documented in the above note.  ARRANGE: Momo was educated on the importance of frequent visits to treat obesity as outlined per CMS and USPSTF guidelines and agreed to schedule her next follow up appointment today.  Wilhemena Durie, am acting as transcriptionist for [employer], DO  I have reviewed the above documentation for accuracy and completeness, and I agree with the above. -Jearld Lesch, DO

## 2018-03-12 ENCOUNTER — Ambulatory Visit (INDEPENDENT_AMBULATORY_CARE_PROVIDER_SITE_OTHER): Payer: 59 | Admitting: Bariatrics

## 2018-03-12 ENCOUNTER — Encounter (INDEPENDENT_AMBULATORY_CARE_PROVIDER_SITE_OTHER): Payer: Self-pay

## 2018-06-04 DIAGNOSIS — L0291 Cutaneous abscess, unspecified: Secondary | ICD-10-CM | POA: Diagnosis not present

## 2018-06-04 DIAGNOSIS — L0390 Cellulitis, unspecified: Secondary | ICD-10-CM | POA: Diagnosis not present

## 2018-08-14 DIAGNOSIS — Z0000 Encounter for general adult medical examination without abnormal findings: Secondary | ICD-10-CM | POA: Diagnosis not present

## 2018-10-30 ENCOUNTER — Ambulatory Visit (HOSPITAL_COMMUNITY)
Admission: RE | Admit: 2018-10-30 | Discharge: 2018-10-30 | Disposition: A | Discharge status: Home / Self Care | Payer: 59 | Source: Ambulatory Visit | Attending: Family Medicine | Admitting: Family Medicine

## 2018-10-30 ENCOUNTER — Other Ambulatory Visit: Payer: Self-pay

## 2018-10-30 ENCOUNTER — Other Ambulatory Visit (HOSPITAL_COMMUNITY): Payer: Self-pay | Admitting: Family Medicine

## 2018-10-30 DIAGNOSIS — M6200 Separation of muscle (nontraumatic), unspecified site: Secondary | ICD-10-CM | POA: Diagnosis not present

## 2018-10-30 DIAGNOSIS — Z01818 Encounter for other preprocedural examination: Secondary | ICD-10-CM

## 2019-05-28 ENCOUNTER — Ambulatory Visit: Payer: 59

## 2019-05-29 ENCOUNTER — Ambulatory Visit: Payer: 59 | Attending: Internal Medicine

## 2019-05-29 DIAGNOSIS — Z23 Encounter for immunization: Secondary | ICD-10-CM

## 2019-05-29 NOTE — Progress Notes (Signed)
   Covid-19 Vaccination Clinic  Name:  Marissa [name]    MRN: [id number] DOB: Dec 04, 1975  05/29/2019  Ms. Marissa [name] was observed post Covid-19 immunization for 15 minutes without incident. She was provided with Vaccine Information Sheet and instruction to access the V-Safe system.   Ms. Marissa [name] was instructed to call 911 with any severe reactions post vaccine: Marland Kitchen Difficulty breathing  . Swelling of face and throat  . A fast heartbeat  . A bad rash all over body  . Dizziness and weakness   Immunizations Administered    Name Date Dose VIS Date Route   Pfizer COVID-19 Vaccine 05/29/2019  3:26 PM 0.3 mL 03/06/2019 Intramuscular   Manufacturer: [place]   Lot: [id number]   [place]: [id number]

## 2019-06-26 ENCOUNTER — Ambulatory Visit: Payer: 59

## 2019-07-10 ENCOUNTER — Telehealth: Payer: Self-pay

## 2019-07-10 NOTE — Telephone Encounter (Signed)
Patient called and rescheduled her 2nd pfizer vaccine.

## 2019-07-14 ENCOUNTER — Ambulatory Visit: Payer: 59 | Attending: Internal Medicine

## 2019-07-14 DIAGNOSIS — Z23 Encounter for immunization: Secondary | ICD-10-CM

## 2019-07-14 NOTE — Progress Notes (Signed)
   Covid-19 Vaccination Clinic  Name:  Orielle Cockey    MRN: [id number] DOB: 11/28/1975  07/14/2019  Ms. Anderson McClinton was observed post Covid-19 immunization for 15 minutes without incident. She was provided with Vaccine Information Sheet and instruction to access the V-Safe system.   Ms. Jeniyah Cauthorn was instructed to call 911 with any severe reactions post vaccine: Marland Kitchen Difficulty breathing  . Swelling of face and throat  . A fast heartbeat  . A bad rash all over body  . Dizziness and weakness   Immunizations Administered    Name Date Dose VIS Date Route   Pfizer COVID-19 Vaccine 07/14/2019 12:19 PM 0.3 mL 05/20/2018 Intramuscular   Manufacturer: [place]   Lot: [id number]   [place]: [id number]

## 2019-07-26 ENCOUNTER — Emergency Department (HOSPITAL_COMMUNITY)
Admission: EM | Admit: 2019-07-26 | Discharge: 2019-07-26 | Disposition: A | Discharge status: Home / Self Care | Payer: 59 | Attending: Emergency Medicine | Admitting: Emergency Medicine

## 2019-07-26 ENCOUNTER — Other Ambulatory Visit: Payer: Self-pay

## 2019-07-26 ENCOUNTER — Encounter (HOSPITAL_COMMUNITY): Payer: Self-pay | Admitting: Emergency Medicine

## 2019-07-26 DIAGNOSIS — R1084 Generalized abdominal pain: Secondary | ICD-10-CM | POA: Diagnosis present

## 2019-07-26 DIAGNOSIS — R197 Diarrhea, unspecified: Secondary | ICD-10-CM | POA: Insufficient documentation

## 2019-07-26 DIAGNOSIS — Z5321 Procedure and treatment not carried out due to patient leaving prior to being seen by health care provider: Secondary | ICD-10-CM | POA: Insufficient documentation

## 2019-07-26 DIAGNOSIS — R112 Nausea with vomiting, unspecified: Secondary | ICD-10-CM | POA: Insufficient documentation

## 2019-07-26 LAB — COMPREHENSIVE METABOLIC PANEL
ALT: 12 U/L (ref 0–44)
AST: 14 U/L — ABNORMAL LOW (ref 15–41)
Albumin: 3.8 g/dL (ref 3.5–5.0)
Alkaline Phosphatase: 82 U/L (ref 38–126)
Anion gap: 10 (ref 5–15)
BUN: 10 mg/dL (ref 6–20)
CO2: 28 mmol/L (ref 22–32)
Calcium: 9.6 mg/dL (ref 8.9–10.3)
Chloride: 101 mmol/L (ref 98–111)
Creatinine, Ser: 0.92 mg/dL (ref 0.44–1.00)
GFR calc Af Amer: >60 mL/min (ref >60)
GFR calc non Af Amer: >60 mL/min (ref >60)
Glucose, Bld: 107 mg/dL — ABNORMAL HIGH (ref 70–99)
Potassium: 3.3 mmol/L — ABNORMAL LOW (ref 3.5–5.1)
Sodium: 139 mmol/L (ref 135–145)
Total Bilirubin: 0.5 mg/dL (ref 0.3–1.2)
Total Protein: 7.5 g/dL (ref 6.5–8.1)

## 2019-07-26 LAB — URINALYSIS, ROUTINE W REFLEX MICROSCOPIC
Bilirubin Urine: NEGATIVE
Glucose, UA: NEGATIVE mg/dL
Hgb urine dipstick: NEGATIVE
Ketones, ur: NEGATIVE mg/dL
Leukocytes,Ua: NEGATIVE
Nitrite: NEGATIVE
Protein, ur: NEGATIVE mg/dL
Specific Gravity, Urine: 1.008 (ref 1.005–1.030)
pH: 6 (ref 5.0–8.0)

## 2019-07-26 LAB — I-STAT BETA HCG BLOOD, ED (MC, WL, AP ONLY): I-stat hCG, quantitative: <5 m[IU]/mL (ref <5)

## 2019-07-26 LAB — CBC
HCT: 40.6 % (ref 36.0–46.0)
Hemoglobin: 13.7 g/dL (ref 12.0–15.0)
MCH: 29.7 pg (ref 26.0–34.0)
MCHC: 33.7 g/dL (ref 30.0–36.0)
MCV: 88.1 fL (ref 80.0–100.0)
Platelets: 348 10*3/uL (ref 150–400)
RBC: 4.61 MIL/uL (ref 3.87–5.11)
RDW: 13.7 % (ref 11.5–15.5)
WBC: 12.5 10*3/uL — ABNORMAL HIGH (ref 4.0–10.5)
nRBC: 0 % (ref 0.0–0.2)

## 2019-07-26 LAB — LIPASE, BLOOD: Lipase: 35 U/L (ref 11–51)

## 2019-07-26 MED ORDER — SODIUM CHLORIDE 0.9% FLUSH: 3.0000 mL | Freq: Once | INTRAVENOUS | Status: DC

## 2019-07-26 NOTE — ED Triage Notes (Signed)
Pt c/o 10/10 generalized abd pain for the past weeks with nausea, vomiting and diarrhea.

## 2019-07-26 NOTE — ED Notes (Signed)
Pt did not answer when called to room

## 2020-09-27 ENCOUNTER — Emergency Department (HOSPITAL_BASED_OUTPATIENT_CLINIC_OR_DEPARTMENT_OTHER)
Admission: EM | Admit: 2020-09-27 | Discharge: 2020-09-27 | Disposition: A | Discharge status: Home / Self Care | Payer: 59 | Attending: Emergency Medicine | Admitting: Emergency Medicine

## 2020-09-27 ENCOUNTER — Other Ambulatory Visit: Payer: Self-pay

## 2020-09-27 ENCOUNTER — Encounter (HOSPITAL_BASED_OUTPATIENT_CLINIC_OR_DEPARTMENT_OTHER): Payer: Self-pay | Admitting: Emergency Medicine

## 2020-09-27 DIAGNOSIS — I10 Essential (primary) hypertension: Secondary | ICD-10-CM | POA: Insufficient documentation

## 2020-09-27 DIAGNOSIS — R197 Diarrhea, unspecified: Secondary | ICD-10-CM | POA: Insufficient documentation

## 2020-09-27 DIAGNOSIS — R1013 Epigastric pain: Secondary | ICD-10-CM | POA: Diagnosis not present

## 2020-09-27 DIAGNOSIS — R1084 Generalized abdominal pain: Secondary | ICD-10-CM | POA: Insufficient documentation

## 2020-09-27 DIAGNOSIS — R112 Nausea with vomiting, unspecified: Secondary | ICD-10-CM

## 2020-09-27 DIAGNOSIS — Z79899 Other long term (current) drug therapy: Secondary | ICD-10-CM | POA: Diagnosis not present

## 2020-09-27 DIAGNOSIS — R109 Unspecified abdominal pain: Secondary | ICD-10-CM

## 2020-09-27 LAB — CBC WITH DIFFERENTIAL/PLATELET
Abs Immature Granulocytes: 0.03 10*3/uL (ref 0.00–0.07)
Basophils Absolute: 0 10*3/uL (ref 0.0–0.1)
Basophils Relative: 0 %
Eosinophils Absolute: 0.2 10*3/uL (ref 0.0–0.5)
Eosinophils Relative: 2 %
HCT: 44 % (ref 36.0–46.0)
Hemoglobin: 14.9 g/dL (ref 12.0–15.0)
Immature Granulocytes: 0 %
Lymphocytes Relative: 17 %
Lymphs Abs: 2.1 10*3/uL (ref 0.7–4.0)
MCH: 29.1 pg (ref 26.0–34.0)
MCHC: 33.9 g/dL (ref 30.0–36.0)
MCV: 85.9 fL (ref 80.0–100.0)
Monocytes Absolute: 0.6 10*3/uL (ref 0.1–1.0)
Monocytes Relative: 5 %
Neutro Abs: 9.2 10*3/uL — ABNORMAL HIGH (ref 1.7–7.7)
Neutrophils Relative %: 76 %
Platelets: 311 10*3/uL (ref 150–400)
RBC: 5.12 MIL/uL — ABNORMAL HIGH (ref 3.87–5.11)
RDW: 13.7 % (ref 11.5–15.5)
WBC: 12.1 10*3/uL — ABNORMAL HIGH (ref 4.0–10.5)
nRBC: 0 % (ref 0.0–0.2)

## 2020-09-27 LAB — COMPREHENSIVE METABOLIC PANEL
ALT: 12 U/L (ref 0–44)
AST: 12 U/L — ABNORMAL LOW (ref 15–41)
Albumin: 4.5 g/dL (ref 3.5–5.0)
Alkaline Phosphatase: 89 U/L (ref 38–126)
Anion gap: 9 (ref 5–15)
BUN: 17 mg/dL (ref 6–20)
CO2: 32 mmol/L (ref 22–32)
Calcium: 10 mg/dL (ref 8.9–10.3)
Chloride: 98 mmol/L (ref 98–111)
Creatinine, Ser: 0.99 mg/dL (ref 0.44–1.00)
GFR, Estimated: >60 mL/min (ref >60)
Glucose, Bld: 106 mg/dL — ABNORMAL HIGH (ref 70–99)
Potassium: 2.9 mmol/L — ABNORMAL LOW (ref 3.5–5.1)
Sodium: 139 mmol/L (ref 135–145)
Total Bilirubin: 0.4 mg/dL (ref 0.3–1.2)
Total Protein: 8.4 g/dL — ABNORMAL HIGH (ref 6.5–8.1)

## 2020-09-27 LAB — URINALYSIS, ROUTINE W REFLEX MICROSCOPIC
Bilirubin Urine: NEGATIVE
Glucose, UA: NEGATIVE mg/dL
Hgb urine dipstick: NEGATIVE
Ketones, ur: NEGATIVE mg/dL
Leukocytes,Ua: NEGATIVE
Nitrite: NEGATIVE
Protein, ur: NEGATIVE mg/dL
Specific Gravity, Urine: 1.006 (ref 1.005–1.030)
pH: 6.5 (ref 5.0–8.0)

## 2020-09-27 LAB — LIPASE, BLOOD: Lipase: 45 U/L (ref 11–51)

## 2020-09-27 MED ORDER — SODIUM CHLORIDE 0.9 % IV BOLUS
1000.0000 mL | Freq: Once | INTRAVENOUS | Status: AC
  Administered 2020-09-27: 1000 mL via INTRAVENOUS

## 2020-09-27 MED ORDER — HYOSCYAMINE SULFATE 0.125 MG SL SUBL
0.2500 mg | SUBLINGUAL_TABLET | Freq: Once | SUBLINGUAL | Status: AC
  Administered 2020-09-27: 0.25 mg via SUBLINGUAL
  Filled 2020-09-27: qty 2

## 2020-09-27 MED ORDER — ONDANSETRON 4 MG PO TBDP: 4.0000 mg | ORAL_TABLET | Freq: Three times a day (TID) | ORAL | 0 refills | Status: AC | PRN

## 2020-09-27 MED ORDER — PROCHLORPERAZINE EDISYLATE 10 MG/2ML IJ SOLN
10.0000 mg | Freq: Once | INTRAMUSCULAR | Status: AC
  Administered 2020-09-27: 10 mg via INTRAVENOUS
  Filled 2020-09-27: qty 2

## 2020-09-27 MED ORDER — ONDANSETRON HCL 4 MG/2ML IJ SOLN
4.0000 mg | Freq: Once | INTRAMUSCULAR | Status: AC
  Administered 2020-09-27: 4 mg via INTRAVENOUS
  Filled 2020-09-27: qty 2

## 2020-09-27 MED ORDER — LIDOCAINE VISCOUS HCL 2 % MT SOLN
15.0000 mL | Freq: Once | OROMUCOSAL | Status: AC
  Administered 2020-09-27: 15 mL via ORAL
  Filled 2020-09-27: qty 15

## 2020-09-27 MED ORDER — ALUM & MAG HYDROXIDE-SIMETH 200-200-20 MG/5ML PO SUSP
30.0000 mL | Freq: Once | ORAL | Status: AC
  Administered 2020-09-27: 30 mL via ORAL
  Filled 2020-09-27: qty 30

## 2020-09-27 MED ORDER — PROCHLORPERAZINE MALEATE 10 MG PO TABS: 10.0000 mg | ORAL_TABLET | Freq: Two times a day (BID) | ORAL | 0 refills | Status: DC | PRN

## 2020-09-27 NOTE — ED Provider Notes (Signed)
[facility] EMERGENCY DEPT Provider Note  CSN: [id number] Arrival date & time: 09/27/20 0204  Chief Complaint(s) Abdominal Pain  HPI Derrick Orris is a 45 y.o. female    Abdominal Pain Pain location:  Epigastric Pain quality: cramping   Pain radiates to:  Does not radiate Pain severity:  Moderate Onset quality:  Gradual Duration:  3 days Timing:  Intermittent Progression:  Waxing and waning Chronicity:  New Context comment:  Possible suspicious food intake Relieved by:  Nothing Worsened by:  Nothing Ineffective treatments:  Antacids Associated symptoms: diarrhea, nausea and vomiting   Associated symptoms: no chills and no fever   Risk factors: no alcohol abuse    Past Medical History Past Medical History:  Diagnosis Date   Back pain    Endometrial polyp    Endometriosis    Hip pain, bilateral    Hypertension    Patient Active Problem List   Diagnosis Date Noted   Insulin resistance 02/17/2018   Class 2 severe obesity with serious comorbidity and body mass index (BMI) of 37.0 to 37.9 in adult [facility]) 02/17/2018   Postpartum care following cesarean delivery 7/4 09/26/2016   Status post repeat low transverse cesarean section / failed TOLAC, arrest of dilation, presumed chorioamnionitis, PIH 09/26/2016   Ovarian cyst, right 05/19/2013   Essential hypertension 06/25/2012   Home Medication(s) Prior to Admission medications   Medication Sig Start Date End Date Taking? Authorizing Provider  ondansetron (ZOFRAN ODT) 4 MG disintegrating tablet Take 1 tablet (4 mg total) by mouth every 8 (eight) hours as needed for up to 3 days for nausea or vomiting. 09/27/20 09/30/20 Yes Victora Irby, Grayce Sessions, MD  prochlorperazine (COMPAZINE) 10 MG tablet Take 1 tablet (10 mg total) by mouth 2 (two) times daily as needed for nausea or vomiting. 09/27/20  Yes Keandria Berrocal, Grayce Sessions, MD  hydrochlorothiazide (MICROZIDE) 12.5 MG capsule Take 12.5 mg by mouth daily.     [provider]  magnesium oxide (MAG-OX) 400 (241.3 Mg) MG tablet Take 1 tablet (400 mg total) by mouth daily. 09/29/16   Artelia Laroche, CNM  Multiple Vitamins-Minerals (MULTIVITAMIN WITH MINERALS) tablet Take 1 tablet by mouth daily.    [provider]  polyethylene glycol powder (GLYCOLAX/MIRALAX) powder Take 17 g by mouth daily. 02/25/18   Georgia Lopes, DO  Prenatal Vit-Fe Fumarate-FA (PRENATAL MULTIVITAMIN) TABS tablet Take 1 tablet by mouth daily at 12 noon.    [provider]  Vitamins-Lipotropics (COMPLEX B-100 PO) Take by mouth.    [provider]                                                                                                                                    Past Surgical History Past Surgical History:  Procedure Laterality Date   CESAREAN SECTION  1995   CESAREAN SECTION N/A 09/26/2016   Procedure: CESAREAN SECTION;  Surgeon: Servando Salina, MD;  Location:  [place];  Service: Obstetrics;  Laterality: N/A;   HYSTEROSCOPY N/A 04/14/2014   Procedure: HYSTEROSCOPY, SUCTION D&C, RESECTION OF MYOMA;  Surgeon: Governor Specking, MD;  Location: [place];  Service: Gynecology;  Laterality: N/A;   LAPAROSCOPY N/A 04/14/2014   Procedure: LAPAROSCOPY, EXCISION AND ABLATION OF ENDOMETRIOSIS, CHROMOTUBATION;  Surgeon: Governor Specking, MD;  Location: [place];  Service: Gynecology;  Laterality: N/A;   TONSILLECTOMY  12-21-2003   12-27-2003--  CAUTERIZATION RIGHT TONSILLAR BLEED POST-OP   Family History Family History  Problem Relation Age of Onset   Hypertension Paternal Grandmother    Stroke Paternal Grandmother    Stroke Mother    Hypertension Mother     Social History Social History   Tobacco Use   Smoking status: Never   Smokeless tobacco: Never  Substance Use Topics   Alcohol use: No   Drug use: No   Allergies Penicillins  Review of Systems Review of Systems  Constitutional:   Negative for chills and fever.  Gastrointestinal:  Positive for abdominal pain, diarrhea, nausea and vomiting.  All other systems are reviewed and are negative for acute change except as noted in the HPI  Physical Exam Vital Signs  I have reviewed the triage vital signs BP (!) 135/94 (BP Location: Right Arm)   Pulse 77   Temp 98 F (36.7 C) (Oral)   Resp 18   Ht 5\' 1"  (1.549 m)   Wt 86.2 kg   SpO2 99%   BMI 35.90 kg/m   Physical Exam Vitals reviewed.  Constitutional:      General: She is not in acute distress.    Appearance: She is well-developed. She is not diaphoretic.  HENT:     Head: Normocephalic and atraumatic.     Right Ear: External ear normal.     Left Ear: External ear normal.     Nose: Nose normal.  Eyes:     General: No scleral icterus.    Conjunctiva/sclera: Conjunctivae normal.  Neck:     Trachea: Phonation normal.  Cardiovascular:     Rate and Rhythm: Normal rate and regular rhythm.  Pulmonary:     Effort: Pulmonary effort is normal. No respiratory distress.     Breath sounds: No stridor.  Abdominal:     General: There is no distension.     Tenderness: There is generalized abdominal tenderness (mild discomfort that resolved after a few seconds of deep palpation). There is no guarding or rebound.  Musculoskeletal:        General: Normal range of motion.     Cervical back: Normal range of motion.  Neurological:     Mental Status: She is alert and oriented to person, place, and time.  Psychiatric:        Behavior: Behavior normal.    ED Results and Treatments Labs (all labs ordered are listed, but only abnormal results are displayed) Labs Reviewed  COMPREHENSIVE METABOLIC PANEL - Abnormal; Notable for the following components:      Result Value   Potassium 2.9 (*)    Glucose, Bld 106 (*)    Total Protein 8.4 (*)    AST 12 (*)    All other components within normal limits  CBC WITH DIFFERENTIAL/PLATELET - Abnormal; Notable for the following  components:   WBC 12.1 (*)    RBC 5.12 (*)    Neutro Abs 9.2 (*)    All other components within normal limits  URINALYSIS, ROUTINE W REFLEX MICROSCOPIC - Abnormal;  Notable for the following components:   Color, Urine COLORLESS (*)    All other components within normal limits  LIPASE, BLOOD                                                                                                                         EKG  EKG Interpretation  Date/Time:    Ventricular Rate:    PR Interval:    QRS Duration:   QT Interval:    QTC Calculation:   R Axis:     Text Interpretation:          Radiology No results found.  Pertinent labs & imaging results that were available during my care of the patient were reviewed by me and considered in my medical decision making (see chart for details).  Medications Ordered in ED Medications  ondansetron (ZOFRAN) injection 4 mg (4 mg Intravenous Given 09/27/20 0300)  sodium chloride 0.9 % bolus 1,000 mL (0 mLs Intravenous Stopped 09/27/20 0441)  alum & mag hydroxide-simeth (MAALOX/MYLANTA) 200-200-20 MG/5ML suspension 30 mL (30 mLs Oral Given 09/27/20 0300)    And  lidocaine (XYLOCAINE) 2 % viscous mouth solution 15 mL (15 mLs Oral Given 09/27/20 0300)  hyoscyamine (LEVSIN SL) SL tablet 0.25 mg (0.25 mg Sublingual Given 09/27/20 0300)  prochlorperazine (COMPAZINE) injection 10 mg (10 mg Intravenous Given 09/27/20 0438)                                                                                                                                    Procedures Procedures  (including critical care time)  Medical Decision Making / ED Course I have reviewed the nursing notes for this encounter and the patient's prior records (if available in EHR or on provided paperwork).   AVANGELINE STOCKBURGER McClinton was evaluated in Emergency Department on 09/27/2020 for the symptoms described in the history of present illness. She was evaluated in the context of the global COVID-19  pandemic, which necessitated consideration that the patient might be at risk for infection with the SARS-CoV-2 virus that causes COVID-19. Institutional protocols and algorithms that pertain to the evaluation of patients at risk for COVID-19 are in a state of rapid change based on information released by regulatory bodies including the CDC and federal and state organizations. These policies and algorithms were followed during the patient's care in the ED.  45 y.o. female presents with vomiting, diarrhea,  and abdominal cramping for 2-3 days. Reports possible suspicious food intake.  Slight decrease in oral tolerance. Rest of history as above.  Patient appears well, not in distress, and with no signs of toxicity or dehydration. Abdomen benign. Rest of the exam as above  Most consistent with viral gastroenteritis vs food poisoning.  Screening labs with leukocytosis, no anemia, mild hypoK+ (likely from GI losses), no renal insufficiency, biliary obstruction or evidence of pancreatitis.  Doubt appendicitis, diverticulitis, severe colitis, dysentery.    Patient treated symptomatically and abd cramping resolved after GI cocktail with Levsin.  Able to tolerate oral intake in the ED.  Discussed symptomatic treatment with the patient and they will follow closely with their PCP.       Final Clinical Impression(s) / ED Diagnoses Final diagnoses:  Nausea vomiting and diarrhea  Abdominal cramping   The patient appears reasonably screened and/or stabilized for discharge and I doubt any other medical condition or other [facility] requiring further screening, evaluation, or treatment in the ED at this time prior to discharge. Safe for discharge with strict return precautions.  Disposition: Discharge  Condition: Good  I have discussed the results, Dx and Tx plan with the patient/family who expressed understanding and agree(s) with the plan. Discharge instructions discussed at length. The patient/family was  given strict return precautions who verbalized understanding of the instructions. No further questions at time of discharge.    ED Discharge Orders          Ordered    ondansetron (ZOFRAN ODT) 4 MG disintegrating tablet  Every 8 hours PRN        09/27/20 0522    prochlorperazine (COMPAZINE) 10 MG tablet  2 times daily PRN        09/27/20 0522            Follow Up: Glendale Chard, MD [address] [place] [address] [phone]  Call  as needed     This chart was dictated using voice recognition software.  Despite best efforts to proofread,  errors can occur which can change the documentation meaning.    Fatima Blank, MD 09/27/20 [phone]

## 2020-09-27 NOTE — ED Triage Notes (Signed)
Pt c/o epigastric abd pain x 3 days. Pt states that the pain got worse today and that she is now having n/v. Pt states she did vomit blood last yesterday. Pt states that she has had this happen in the past but not to this extent. Pt aaox3, ambulatory with assistance, VSS, GCS 15.

## 2020-09-30 ENCOUNTER — Other Ambulatory Visit (HOSPITAL_COMMUNITY): Payer: Self-pay | Admitting: Gastroenterology

## 2020-09-30 DIAGNOSIS — R1012 Left upper quadrant pain: Secondary | ICD-10-CM

## 2020-09-30 DIAGNOSIS — R112 Nausea with vomiting, unspecified: Secondary | ICD-10-CM

## 2020-10-03 ENCOUNTER — Ambulatory Visit (HOSPITAL_BASED_OUTPATIENT_CLINIC_OR_DEPARTMENT_OTHER)
Admission: RE | Admit: 2020-10-03 | Discharge: 2020-10-03 | Disposition: A | Discharge status: Home / Self Care | Payer: 59 | Source: Ambulatory Visit | Attending: Gastroenterology | Admitting: Gastroenterology

## 2020-10-03 ENCOUNTER — Other Ambulatory Visit: Payer: Self-pay

## 2020-10-03 DIAGNOSIS — R112 Nausea with vomiting, unspecified: Secondary | ICD-10-CM | POA: Insufficient documentation

## 2020-10-03 DIAGNOSIS — R1012 Left upper quadrant pain: Secondary | ICD-10-CM | POA: Diagnosis present

## 2020-10-04 ENCOUNTER — Other Ambulatory Visit (HOSPITAL_COMMUNITY): Payer: Self-pay | Admitting: Gastroenterology

## 2020-10-04 DIAGNOSIS — R112 Nausea with vomiting, unspecified: Secondary | ICD-10-CM

## 2020-10-06 ENCOUNTER — Ambulatory Visit: Payer: 59 | Admitting: Physician Assistant

## 2020-10-18 ENCOUNTER — Ambulatory Visit (HOSPITAL_COMMUNITY): Admission: RE | Admit: 2020-10-18 | Payer: 59 | Source: Ambulatory Visit

## 2020-10-18 ENCOUNTER — Encounter (HOSPITAL_COMMUNITY): Payer: Self-pay

## 2021-02-02 ENCOUNTER — Encounter: Payer: Self-pay | Admitting: Physical Therapy

## 2021-02-02 ENCOUNTER — Other Ambulatory Visit: Payer: Self-pay

## 2021-02-02 ENCOUNTER — Ambulatory Visit: Payer: 59 | Attending: Family Medicine | Admitting: Physical Therapy

## 2021-02-02 DIAGNOSIS — R293 Abnormal posture: Secondary | ICD-10-CM | POA: Insufficient documentation

## 2021-02-02 DIAGNOSIS — M6281 Muscle weakness (generalized): Secondary | ICD-10-CM | POA: Insufficient documentation

## 2021-02-02 NOTE — Patient Instructions (Signed)
Access Code: [email or web address] URL: [email or web address] Date: 02/02/2021 Prepared by: Jari Favre  Exercises Hooklying Rib Cage Breathing - 1 x daily - 7 x weekly - 3 sets - 10 reps Seated Thoracic Extension with Hands Behind Neck - 1 x daily - 7 x weekly - 3 sets - 10 reps

## 2021-02-02 NOTE — Therapy (Signed)
[place] [facility] @ [address] [place], [place], [address] Phone: [phone]   Fax:  [phone]  Physical Therapy Evaluation  Patient Details  Name: Marissa [name] MRN: [id number] Date of Birth: 1975/06/06 Referring Provider (PT): Valentino Nose, FNP   Encounter Date: 02/02/2021   PT End of Session - 02/02/21 0928     Visit Number 1    Date for PT Re-Evaluation 04/27/21    Authorization Type U[employer]    PT Start Time 0930    PT Stop Time 1012    PT Time Calculation (min) 42 min    Activity Tolerance Patient tolerated treatment well    Behavior During Therapy [facility] for tasks assessed/performed             Past Medical History:  Diagnosis Date   Back pain    Endometrial polyp    Endometriosis    Hip pain, bilateral    Hypertension     Past Surgical History:  Procedure Laterality Date   CESAREAN SECTION  1995   CESAREAN SECTION N/A 09/26/2016   Procedure: CESAREAN SECTION;  Surgeon: Servando Salina, MD;  Location: [place];  Service: Obstetrics;  Laterality: N/A;   HYSTEROSCOPY N/A 04/14/2014   Procedure: HYSTEROSCOPY, SUCTION D&C, RESECTION OF MYOMA;  Surgeon: Governor Specking, MD;  Location: [place];  Service: Gynecology;  Laterality: N/A;   LAPAROSCOPY N/A 04/14/2014   Procedure: LAPAROSCOPY, EXCISION AND ABLATION OF ENDOMETRIOSIS, CHROMOTUBATION;  Surgeon: Governor Specking, MD;  Location: [place];  Service: Gynecology;  Laterality: N/A;   TONSILLECTOMY  12-21-2003   12-27-2003--  CAUTERIZATION RIGHT TONSILLAR BLEED POST-OP    There were no vitals filed for this visit.    Subjective Assessment - 02/02/21 0931     Subjective Pt has urgency, frequency, urge with leakage.  Pt is wearing pad 2x/day (liner).  Pt had these symptoms especially after the last baby 4 years ago. If I laugh with a full bladder.  Typically I have a large amount of urine when I go.  I  go about 7x/day.  No nocturia.  I used to be able to do kegel exercises. I have constipation and strain to go    Patient Stated Goals get rid of urgency and leakage    Currently in Pain? No/denies                [facility] PT Assessment - 02/02/21 0001       Assessment   Medical Diagnosis N32.81 (ICD-10-CM) - Overactive bladder    Referring Provider (PT) Valentino Nose, FNP    Prior Therapy No      Precautions   Precautions None      Balance Screen   Has the patient fallen in the past 6 months No      [place] residence    Living Arrangements Children;Spouse/significant other   2 children and step daughter     Prior Function   Level of Independence Independent    Vocation Full time employment    Chief Technology Officer for ob/gyn      Cognition   Overall Cognitive Status Within Functional Limits for tasks assessed      Functional Tests   Functional tests Single leg stance      Single Leg Stance   Comments normal      Posture/Postural Control   Posture/Postural Control Postural limitations    Postural Limitations Decreased thoracic  kyphosis   Rt ilium ant rotation     ROM / Strength   AROM / PROM / Strength AROM;PROM;Strength      AROM   Overall AROM Comments lumbar flexion and thoracic rotation right limited      PROM   Overall PROM Comments hip flexion and ER limited 25%      Strength   Overall Strength Comments 5/5 hip strength; core strength limitation      Flexibility   Soft Tissue Assessment /Muscle Length yes    Hamstrings normal      Palpation   Palpation comment thoracic an dlumbar tight                        Objective measurements completed on examination: See above findings.     Pelvic Floor Special Questions - 02/02/21 0001     Prior Pelvic/Prostate Exam Yes    Are you Pregnant or attempting pregnancy? No    Prior Pregnancies Yes    Number of Pregnancies 4    Number of C-Sections 2     Number of Vaginal Deliveries 2   vbac   Currently Sexually Active Yes    Is this Painful No    Urinary Leakage Yes    How often daily    Pad use 2 liners/day    Activities that cause leaking Laughing;Coughing;Sneezing;With strong urge    Urinary urgency Yes    Urinary frequency feels like she is going a lot    Fecal incontinence No   constipation and straining   Falling out feeling (prolapse) No    Skin Integrity Intact    Scar none    Perineal Body/Introitus  Elevated    External Palpation keloid scar across lower abdomen    Prolapse None    Pelvic Floor Internal Exam pt identity confirmed and informed consent given to perform soft tissue assessment    Exam Type Vaginal    Palpation tight Rt>Lt; holding tension after relaxing    Strength weak squeeze, no lift    Strength # of reps --   7 quick   Strength # of seconds 3    Tone high            No emotional/communication barriers or cognitive limitation. Patient is motivated to learn. Patient understands and agrees with treatment goals and plan. PT explains patient will be examined in standing, sitting, and lying down to see how their muscles and joints work. When they are ready, they will be asked to remove their underwear so PT can examine their perineum. The patient is also given the option of providing their own chaperone as one is not provided in our facility. The patient also has the right and is explained the right to defer or refuse any part of the evaluation or treatment including the internal exam. With the patient's consent, PT will use one gloved finger to gently assess the muscles of the pelvic floor, seeing how well it contracts and relaxes and if there is muscle symmetry. After, the patient will get dressed and PT and patient will discuss exam findings and plan of care. PT and patient discuss plan of care, schedule, attendance policy and HEP activities.   [place] Adult PT Treatment/Exercise - 02/02/21 0001        Self-Care   Self-Care Other Self-Care Comments    Other Self-Care Comments  initial HEP  PT Education - 02/02/21 1015     Education Details Access Code: [email or web address]    Person(s) Educated Patient    Methods Explanation;Demonstration;Tactile cues;Verbal cues;Handout    Comprehension Verbalized understanding;Returned demonstration              PT Short Term Goals - 02/02/21 1127       PT SHORT TERM GOAL #1   Title ind with initial HEP    Time 4    Period Weeks    Status New    Target Date 03/02/21               PT Long Term Goals - 02/02/21 1121       PT LONG TERM GOAL #1   Title Pt will report at least 50% less urgency    Time 12    Period Weeks    Status New    Target Date 04/27/21      PT LONG TERM GOAL #2   Title Pt will demonstrate she can perform kegel for at least 10 seconds for greater control with coughing or laughing    Time 12    Period Weeks    Status New    Target Date 04/27/21      PT LONG TERM GOAL #3   Title Pt will be ind with advanced HEP    Time 12    Period Weeks    Status New    Target Date 04/27/21      PT LONG TERM GOAL #4   Title Pt will not have to strain to have a BM due to improved muscle coordination    Time 12    Period Weeks    Status New    Target Date 04/27/21                    Plan - 02/02/21 1015     Clinical Impression Statement Thoracic rotation to the Right is limited. Rt ilium anterior rotation.  Pt has keloid scarring across abdomen that is restricted and around umbilicus is also restricted.  Pt has decreased thoracic kyphosis and limited ribcage mobility with more anterior protrusion.  Ribcage appears limited due to tension in lumbar and thoracic spine as well as abdominal weakness.  Pt has weakness of pelvic floor with high tone presentation.  There is not a lot of movement of the perineum when bulging and contracting.  Pt can hold for 3 seconds then strength reduces  strength.  Pt will benefit from skilled PT to address these impairments and address bladder control and improved quality of life.    Personal Factors and Comorbidities Comorbidity 3+    Comorbidities endo, c-section x 2, vback x2; back pain    Examination-Activity Limitations Continence;Toileting    Stability/Clinical Decision Making Evolving/Moderate complexity    Clinical Decision Making Moderate    Rehab Potential Excellent    PT Frequency 1x / week    PT Duration 12 weeks    PT Treatment/Interventions ADLs/Self Care Home Management;Biofeedback;Cryotherapy;Electrical Stimulation;Moist Heat;Neuromuscular re-education;Therapeutic exercise;Therapeutic activities;Patient/family education;Manual techniques;Dry needling;Passive range of motion;Scar mobilization;Taping    PT Next Visit Plan myofascial release from bladder to umbilicus; scar massage with cupping, thoracic and ribcage mobility    PT Home Exercise Plan Access Code: [email or web address]    Consulted and Agree with Plan of Care Patient             Patient will benefit from skilled therapeutic intervention in order to improve the following deficits and impairments:  Pain, Postural dysfunction, Decreased strength, Decreased coordination, Decreased range of motion, Decreased endurance  Visit Diagnosis: Muscle weakness (generalized)  Abnormal posture     Problem List Patient Active Problem List   Diagnosis Date Noted   Insulin resistance 02/17/2018   Class 2 severe obesity with serious comorbidity and body mass index (BMI) of 37.0 to 37.9 in adult [facility]) 02/17/2018   Postpartum care following cesarean delivery 7/4 09/26/2016   Status post repeat low transverse cesarean section / failed TOLAC, arrest of dilation, presumed chorioamnionitis, PIH 09/26/2016   Ovarian cyst, right 05/19/2013   Essential hypertension 06/25/2012    Jule Ser, PT 02/02/2021, 11:39 AM  [facility] @  [address] [place], [place], [address] Phone: [phone]   Fax:  [phone]  Name: Marissa [name] MRN: [id number] Date of Birth: Apr 13, 1975

## 2021-02-23 ENCOUNTER — Encounter: Payer: Self-pay | Admitting: Physical Therapy

## 2021-02-23 ENCOUNTER — Other Ambulatory Visit: Payer: Self-pay

## 2021-02-23 ENCOUNTER — Ambulatory Visit: Payer: 59 | Attending: Family Medicine | Admitting: Physical Therapy

## 2021-02-23 DIAGNOSIS — M6281 Muscle weakness (generalized): Secondary | ICD-10-CM | POA: Insufficient documentation

## 2021-02-23 DIAGNOSIS — R293 Abnormal posture: Secondary | ICD-10-CM | POA: Insufficient documentation

## 2021-02-23 NOTE — Therapy (Signed)
[facility] @ [place], [place], [address] Phone: [phone]   Fax:  [phone]  Physical Therapy Treatment  Patient Details  Name: Marissa [name] MRN: [id number] Date of Birth: 1975-06-29 Referring Provider (PT): Valentino Nose, FNP   Encounter Date: 02/23/2021   PT End of Session - 02/23/21 1617     Visit Number 2    Date for PT Re-Evaluation 04/27/21    Authorization Type UHC    PT Start Time 0354    PT Stop Time 6568    PT Time Calculation (min) 38 min    Activity Tolerance Patient tolerated treatment well    Behavior During Therapy [facility] for tasks assessed/performed             Past Medical History:  Diagnosis Date   Back pain    Endometrial polyp    Endometriosis    Hip pain, bilateral    Hypertension     Past Surgical History:  Procedure Laterality Date   CESAREAN SECTION  1995   CESAREAN SECTION N/A 09/26/2016   Procedure: CESAREAN SECTION;  Surgeon: Servando Salina, MD;  Location: [place];  Service: Obstetrics;  Laterality: N/A;   HYSTEROSCOPY N/A 04/14/2014   Procedure: HYSTEROSCOPY, SUCTION D&C, RESECTION OF MYOMA;  Surgeon: Governor Specking, MD;  Location: [place];  Service: Gynecology;  Laterality: N/A;   LAPAROSCOPY N/A 04/14/2014   Procedure: LAPAROSCOPY, EXCISION AND ABLATION OF ENDOMETRIOSIS, CHROMOTUBATION;  Surgeon: Governor Specking, MD;  Location: [place];  Service: Gynecology;  Laterality: N/A;   TONSILLECTOMY  12-21-2003   12-27-2003--  CAUTERIZATION RIGHT TONSILLAR BLEED POST-OP    There were no vitals filed for this visit.   Subjective Assessment - 02/23/21 1719     Subjective I am about the same and worse when I was sick because I was coughing and sneezing a lot.    Patient Stated Goals get rid of urgency and leakage    Currently in Pain? No/denies                               [facility]  Adult PT Treatment/Exercise - 02/23/21 0001       Self-Care   Other Self-Care Comments  toileting and urge technique      Exercises   Exercises Lumbar      Lumbar Exercises: Stretches   Figure 4 Stretch 2 reps;30 seconds;Supine;Seated    Other Lumbar Stretch Exercise thoracic extension, rotation - 10 x 5 sec      Manual Therapy   Manual Therapy Myofascial release;Soft tissue mobilization    Myofascial Release scar tissue manual and cupping lower abdomen; bladder release and more release felt on the lower right side                       PT Short Term Goals - 02/02/21 1127       PT SHORT TERM GOAL #1   Title ind with initial HEP    Time 4    Period Weeks    Status New    Target Date 03/02/21               PT Long Term Goals - 02/02/21 1121       PT LONG TERM GOAL #1   Title Pt will report at least 50% less urgency    Time 12  Period Weeks    Status New    Target Date 04/27/21      PT LONG TERM GOAL #2   Title Pt will demonstrate she can perform kegel for at least 10 seconds for greater control with coughing or laughing    Time 12    Period Weeks    Status New    Target Date 04/27/21      PT LONG TERM GOAL #3   Title Pt will be ind with advanced HEP    Time 12    Period Weeks    Status New    Target Date 04/27/21      PT LONG TERM GOAL #4   Title Pt will not have to strain to have a BM due to improved muscle coordination    Time 12    Period Weeks    Status New    Target Date 04/27/21                   Plan - 02/23/21 1613     Clinical Impression Statement Pt at first follow up.  No goals met today.  Today's session included education on toileting and urge techniques.  Scar massage to improve bowel and bladder motility.  Pt was given stretches for improve trunk mobility as well.  Pt will benefit from skilled PT to continue to work on improved pelvic floor muscle function so she can return to functional activities without  leakage.    PT Treatment/Interventions ADLs/Self Care Home Management;Biofeedback;Cryotherapy;Electrical Stimulation;Moist Heat;Neuromuscular re-education;Therapeutic exercise;Therapeutic activities;Patient/family education;Manual techniques;Dry needling;Passive range of motion;Scar mobilization;Taping    PT Next Visit Plan re-assess for bulging the pelvic floor, f/u on urge techniques and toileting, lumbar and abdominal fascia release into ascending and descending colon possibly    PT Home Exercise Plan Access Code: [email or web address]    Consulted and Agree with Plan of Care Patient             Patient will benefit from skilled therapeutic intervention in order to improve the following deficits and impairments:  Pain, Postural dysfunction, Decreased strength, Decreased coordination, Decreased range of motion, Decreased endurance  Visit Diagnosis: Muscle weakness (generalized)  Abnormal posture     Problem List Patient Active Problem List   Diagnosis Date Noted   Insulin resistance 02/17/2018   Class 2 severe obesity with serious comorbidity and body mass index (BMI) of 37.0 to 37.9 in adult [facility]) 02/17/2018   Postpartum care following cesarean delivery 7/4 09/26/2016   Status post repeat low transverse cesarean section / failed TOLAC, arrest of dilation, presumed chorioamnionitis, PIH 09/26/2016   Ovarian cyst, right 05/19/2013   Essential hypertension 06/25/2012    Jule Ser, PT 02/23/2021, 5:22 PM  [place] @ [place], [place], [address] Phone: [phone]   Fax:  [phone]  Name: Marissa [name] MRN: [id number] Date of Birth: 05/02/1975

## 2021-03-02 ENCOUNTER — Encounter: Payer: 59 | Admitting: Physical Therapy

## 2021-03-09 ENCOUNTER — Encounter: Payer: Self-pay | Admitting: Physical Therapy

## 2021-03-09 ENCOUNTER — Ambulatory Visit: Payer: 59 | Admitting: Physical Therapy

## 2021-03-09 ENCOUNTER — Other Ambulatory Visit: Payer: Self-pay

## 2021-03-09 DIAGNOSIS — M6281 Muscle weakness (generalized): Secondary | ICD-10-CM

## 2021-03-09 DIAGNOSIS — R293 Abnormal posture: Secondary | ICD-10-CM

## 2021-03-09 NOTE — Patient Instructions (Addendum)
°  THE KNACK  The Knack is a strategy you may use to help to reduce or prevent leakage or passing of urine, gas or feces during an activity that causes downward force on the pelvic floor muscles.    Activities that can cause downward pressure on the pelvic floor muscles include coughing, sneezing, laughing, bending, lifting, and transitioning from different body positions such as from laying down to sitting up and sitting to standing.  To perform The Knack, consciously squeeze and lift your pelvic floor muscles to perform a strong, well-timed pelvic muscle contraction BEFORE AND DURING these activities above.  As your contraction gets more coordinated and your muscles get stronger, you will become more effective in controlling your experience of incontinence or gas passing during these activities.    [id number]

## 2021-03-09 NOTE — Therapy (Signed)
[place] [facility] @ [place], [place], [address] Phone: [phone]   Fax:  [phone]  Physical Therapy Treatment  Patient Details  Name: Marissa [name] MRN: [id number] Date of Birth: 1975/06/29 Referring Provider (PT): Valentino Nose, FNP   Encounter Date: 03/09/2021   PT End of Session - 03/09/21 1153     Visit Number 3    Date for PT Re-Evaluation 04/27/21    Authorization Type UHC    PT Start Time 8366   arrived   PT Stop Time 1228    PT Time Calculation (min) 35 min    Activity Tolerance Patient tolerated treatment well    Behavior During Therapy [facility] for tasks assessed/performed             Past Medical History:  Diagnosis Date   Back pain    Endometrial polyp    Endometriosis    Hip pain, bilateral    Hypertension     Past Surgical History:  Procedure Laterality Date   CESAREAN SECTION  1995   CESAREAN SECTION N/A 09/26/2016   Procedure: CESAREAN SECTION;  Surgeon: Servando Salina, MD;  Location: [place];  Service: Obstetrics;  Laterality: N/A;   HYSTEROSCOPY N/A 04/14/2014   Procedure: HYSTEROSCOPY, SUCTION D&C, RESECTION OF MYOMA;  Surgeon: Governor Specking, MD;  Location: [place];  Service: Gynecology;  Laterality: N/A;   LAPAROSCOPY N/A 04/14/2014   Procedure: LAPAROSCOPY, EXCISION AND ABLATION OF ENDOMETRIOSIS, CHROMOTUBATION;  Surgeon: Governor Specking, MD;  Location: [place];  Service: Gynecology;  Laterality: N/A;   TONSILLECTOMY  12-21-2003   12-27-2003--  CAUTERIZATION RIGHT TONSILLAR BLEED POST-OP    There were no vitals filed for this visit.   Subjective Assessment - 03/09/21 1233     Subjective The leakage is worse when using the urge.  I have been using the cup for the scar tissue    Patient Stated Goals get rid of urgency and leakage    Currently in Pain? No/denies                                [facility] Adult PT Treatment/Exercise - 03/09/21 0001       Self-Care   Other Self-Care Comments  reviewed urge drill to start sitting down; knack educated and performed      Neuro Re-ed    Neuro Re-ed Details  kegel with exhale and making sure to relax all the way between reps      Lumbar Exercises: Standing   Other Standing Lumbar Exercises kegel in standing and leaning to isolate    Other Standing Lumbar Exercises staggared stance and kegel with exhale      Lumbar Exercises: Supine   Bridge with Ball Squeeze 10 reps    Other Supine Lumbar Exercises ball squeeze and kegel with exhale      Manual Therapy   Manual Therapy Soft tissue mobilization    Soft tissue mobilization lumbar paraspinals                     PT Education - 03/09/21 1227     Education Details [id number] and knack    Person(s) Educated Patient    Methods Explanation;Demonstration;Tactile cues;Verbal cues;Handout    Comprehension Returned demonstration;Verbalized understanding              PT Short Term Goals - 02/02/21 1127  PT SHORT TERM GOAL #1   Title ind with initial HEP    Time 4    Period Weeks    Status New    Target Date 03/02/21               PT Long Term Goals - 02/02/21 1121       PT LONG TERM GOAL #1   Title Pt will report at least 50% less urgency    Time 12    Period Weeks    Status New    Target Date 04/27/21      PT LONG TERM GOAL #2   Title Pt will demonstrate she can perform kegel for at least 10 seconds for greater control with coughing or laughing    Time 12    Period Weeks    Status New    Target Date 04/27/21      PT LONG TERM GOAL #3   Title Pt will be ind with advanced HEP    Time 12    Period Weeks    Status New    Target Date 04/27/21      PT LONG TERM GOAL #4   Title Pt will not have to strain to have a BM due to improved muscle coordination    Time 12    Period Weeks    Status New    Target  Date 04/27/21                   Plan - 03/09/21 1229     Clinical Impression Statement Pt did well with treatment today. She was able to contract and bulge.  Modifications were made to how she was doing urge and she will try to do this sitting initially and then standing.  Pt also taught how to knack for improved leakage with cough and sneeze.  Pt able to progress strength today and gave exercise in HEP    PT Treatment/Interventions ADLs/Self Care Home Management;Biofeedback;Cryotherapy;Electrical Stimulation;Moist Heat;Neuromuscular re-education;Therapeutic exercise;Therapeutic activities;Patient/family education;Manual techniques;Dry needling;Passive range of motion;Scar mobilization;Taping    PT Next Visit Plan f/u on knack, pelvic floor isolating, urge technique done in sitting    PT Home Exercise Plan Access Code: [email or web address]    Consulted and Agree with Plan of Care Patient             Patient will benefit from skilled therapeutic intervention in order to improve the following deficits and impairments:  Pain, Postural dysfunction, Decreased strength, Decreased coordination, Decreased range of motion, Decreased endurance  Visit Diagnosis: Muscle weakness (generalized)  Abnormal posture     Problem List Patient Active Problem List   Diagnosis Date Noted   Insulin resistance 02/17/2018   Class 2 severe obesity with serious comorbidity and body mass index (BMI) of 37.0 to 37.9 in adult [facility]) 02/17/2018   Postpartum care following cesarean delivery 7/4 09/26/2016   Status post repeat low transverse cesarean section / failed TOLAC, arrest of dilation, presumed chorioamnionitis, PIH 09/26/2016   Ovarian cyst, right 05/19/2013   Essential hypertension 06/25/2012    Jule Ser, PT 03/09/2021, 3:33 PM  [facility] @ [place], [place], [address] Phone: [phone]   Fax:  [phone]  Name: Marissa [name] MRN: [id number] Date of Birth: 1975-04-20

## 2021-03-23 ENCOUNTER — Ambulatory Visit: Payer: 59 | Admitting: Physical Therapy

## 2021-03-23 ENCOUNTER — Other Ambulatory Visit: Payer: Self-pay

## 2021-03-23 DIAGNOSIS — R293 Abnormal posture: Secondary | ICD-10-CM

## 2021-03-23 DIAGNOSIS — M6281 Muscle weakness (generalized): Secondary | ICD-10-CM

## 2021-03-23 NOTE — Therapy (Addendum)
[facility] @ [place], [place], [address] Phone: [phone]   Fax:  [phone]  Physical Therapy Treatment  Patient Details  Name: Marissa [name] MRN: [id number] Date of Birth: 07-Sep-1975 Referring Provider (PT): Valentino Nose, FNP   Encounter Date: 03/23/2021   PT End of Session - 03/23/21 1421     Visit Number 4    Date for PT Re-Evaluation 04/27/21    Authorization Type UHC    PT Start Time 9678    PT Stop Time 1440    PT Time Calculation (min) 38 min    Activity Tolerance Patient tolerated treatment well    Behavior During Therapy [facility] for tasks assessed/performed             Past Medical History:  Diagnosis Date   Back pain    Endometrial polyp    Endometriosis    Hip pain, bilateral    Hypertension     Past Surgical History:  Procedure Laterality Date   CESAREAN SECTION  1995   CESAREAN SECTION N/A 09/26/2016   Procedure: CESAREAN SECTION;  Surgeon: Servando Salina, MD;  Location: [place];  Service: Obstetrics;  Laterality: N/A;   HYSTEROSCOPY N/A 04/14/2014   Procedure: HYSTEROSCOPY, SUCTION D&C, RESECTION OF MYOMA;  Surgeon: Governor Specking, MD;  Location: [place];  Service: Gynecology;  Laterality: N/A;   LAPAROSCOPY N/A 04/14/2014   Procedure: LAPAROSCOPY, EXCISION AND ABLATION OF ENDOMETRIOSIS, CHROMOTUBATION;  Surgeon: Governor Specking, MD;  Location: [place];  Service: Gynecology;  Laterality: N/A;   TONSILLECTOMY  12-21-2003   12-27-2003--  CAUTERIZATION RIGHT TONSILLAR BLEED POST-OP    There were no vitals filed for this visit.   Subjective Assessment - 03/23/21 1421     Subjective I didn't have any incidents since last time    Patient Stated Goals get rid of urgency and leakage    Currently in Pain? No/denies                               [facility] Adult PT Treatment/Exercise - 03/23/21 0001        Lumbar Exercises: Standing   Lifting 10 reps    Lifting Weights (lbs) 4    Shoulder Extension Strengthening;Right;Left;20 reps;Theraband    Theraband Level (Shoulder Extension) Level 2 (Red)    Shoulder Extension Limitations isometric flexion 20x    Other Standing Lumbar Exercises side stepping yellow band      Lumbar Exercises: Supine   Bent Knee Raise 20 reps    Large Ball Abdominal Isometric 20 reps    Large Ball Abdominal Isometric Limitations UE and LE pball ex's    Other Supine Lumbar Exercises bent knee fallout           Plank on table with arm reaches 10x each side            PT Short Term Goals - 03/23/21 1444       PT SHORT TERM GOAL #1   Title ind with initial HEP    Status Achieved               PT Long Term Goals - 03/23/21 1444       PT LONG TERM GOAL #1   Title Pt will report at least 50% less urgency    Baseline better    Status On-going  Plan - 03/23/21 1444     Clinical Impression Statement Pt was doing better and no leakage or urgency this week.  Pt was able to progress strengthening exericses today.  Pt needed cues mostly to prevent locking out her knees and to tuck tailbone.  Pt will benefit from skilled PT to work on improved core strength and endurance for full return to function without leakage    PT Treatment/Interventions ADLs/Self Care Home Management;Biofeedback;Cryotherapy;Electrical Stimulation;Moist Heat;Neuromuscular re-education;Therapeutic exercise;Therapeutic activities;Patient/family education;Manual techniques;Dry needling;Passive range of motion;Scar mobilization;Taping    PT Next Visit Plan progress core and pelvic strength as tolerated    PT Home Exercise Plan Access Code: [email or web address]    Consulted and Agree with Plan of Care Patient             Patient will benefit from skilled therapeutic intervention in order to improve the following deficits and impairments:  Pain, Postural  dysfunction, Decreased strength, Decreased coordination, Decreased range of motion, Decreased endurance  Visit Diagnosis: Muscle weakness (generalized)  Abnormal posture     Problem List Patient Active Problem List   Diagnosis Date Noted   Insulin resistance 02/17/2018   Class 2 severe obesity with serious comorbidity and body mass index (BMI) of 37.0 to 37.9 in adult [facility]) 02/17/2018   Postpartum care following cesarean delivery 7/4 09/26/2016   Status post repeat low transverse cesarean section / failed TOLAC, arrest of dilation, presumed chorioamnionitis, PIH 09/26/2016   Ovarian cyst, right 05/19/2013   Essential hypertension 06/25/2012    Marissa [name], PT 03/23/2021, 3:11 PM  [facility] @ [place], [place], [address] Phone: [phone]   Fax:  [phone]  Name: Marissa [name] MRN: [id number] Date of Birth: 1975/10/13   PHYSICAL THERAPY DISCHARGE SUMMARY  Visits from Start of Care: 4  Current functional level related to goals / functional outcomes: See above goals   Remaining deficits: See above   Education / Equipment: HEP   Patient agrees to discharge. Patient goals were met. Patient is being discharged due to not returning since the last visit.  Marland Kitchenjak

## 2021-04-07 ENCOUNTER — Ambulatory Visit: Payer: 59 | Attending: Family Medicine | Admitting: Physical Therapy

## 2021-04-07 DIAGNOSIS — M6281 Muscle weakness (generalized): Secondary | ICD-10-CM | POA: Insufficient documentation

## 2021-04-07 DIAGNOSIS — R293 Abnormal posture: Secondary | ICD-10-CM | POA: Insufficient documentation

## 2021-09-07 ENCOUNTER — Other Ambulatory Visit (HOSPITAL_COMMUNITY): Payer: Self-pay | Admitting: Gastroenterology

## 2021-09-07 ENCOUNTER — Other Ambulatory Visit: Payer: Self-pay | Admitting: Gastroenterology

## 2021-09-07 ENCOUNTER — Emergency Department (HOSPITAL_BASED_OUTPATIENT_CLINIC_OR_DEPARTMENT_OTHER)
Admission: EM | Admit: 2021-09-07 | Discharge: 2021-09-08 | Disposition: A | Discharge status: Home / Self Care | Payer: 59 | Attending: Emergency Medicine | Admitting: Emergency Medicine

## 2021-09-07 ENCOUNTER — Other Ambulatory Visit: Payer: Self-pay

## 2021-09-07 DIAGNOSIS — R112 Nausea with vomiting, unspecified: Secondary | ICD-10-CM | POA: Insufficient documentation

## 2021-09-07 DIAGNOSIS — I10 Essential (primary) hypertension: Secondary | ICD-10-CM | POA: Insufficient documentation

## 2021-09-07 DIAGNOSIS — E876 Hypokalemia: Secondary | ICD-10-CM | POA: Diagnosis not present

## 2021-09-07 DIAGNOSIS — R1011 Right upper quadrant pain: Secondary | ICD-10-CM

## 2021-09-07 DIAGNOSIS — Z79899 Other long term (current) drug therapy: Secondary | ICD-10-CM | POA: Insufficient documentation

## 2021-09-07 DIAGNOSIS — R197 Diarrhea, unspecified: Secondary | ICD-10-CM | POA: Diagnosis not present

## 2021-09-07 MED ORDER — HYDROMORPHONE HCL 1 MG/ML IJ SOLN
1.0000 mg | Freq: Once | INTRAMUSCULAR | Status: AC
  Administered 2021-09-08: 1 mg via INTRAVENOUS
  Filled 2021-09-07: qty 1

## 2021-09-07 MED ORDER — SODIUM CHLORIDE 0.9 % IV BOLUS
1000.0000 mL | Freq: Once | INTRAVENOUS | Status: AC
  Administered 2021-09-08: 1000 mL via INTRAVENOUS

## 2021-09-07 MED ORDER — METOCLOPRAMIDE HCL 5 MG/ML IJ SOLN
10.0000 mg | Freq: Once | INTRAMUSCULAR | Status: DC | PRN
  Filled 2021-09-07: qty 2

## 2021-09-07 MED ORDER — ONDANSETRON HCL 4 MG/2ML IJ SOLN
4.0000 mg | Freq: Once | INTRAMUSCULAR | Status: AC
  Administered 2021-09-08: 4 mg via INTRAVENOUS
  Filled 2021-09-07: qty 2

## 2021-09-07 NOTE — ED Provider Notes (Signed)
[facility] EMERGENC[facility] Provider Note: Georgena Spurling, MD, FACEP  CSN: [id number] MRN: [id number] ARRIVAL: 09/07/21 at [place]  Abdominal Pain   HISTORY OF PRESENT ILLNESS  09/07/21 11:29 PM Marissa [name] is a 46 y.o. female with 5 days of right upper quadrant abdominal pain radiating to her back.  It has been associated with nausea, vomiting and diarrhea.  She rates her pain as a 10 out of 10.  She got tired of dealing with the pain so came to the ED.  She is scheduled for gallbladder ultrasound tomorrow.  She had similar pain a year ago which was thought to be her gallbladder but her ultrasound was unremarkable.  She had been having urticarial rash the past several evenings but was seen at an urgent care yesterday and treated with steroids and Benadryl and has not had a recurrence.   Past Medical History:  Diagnosis Date   Back pain    Endometrial polyp    Endometriosis    Hip pain, bilateral    Hypertension     Past Surgical History:  Procedure Laterality Date   CESAREAN SECTION  1995   CESAREAN SECTION N/A 09/26/2016   Procedure: CESAREAN SECTION;  Surgeon: Servando Salina, MD;  Location: [place];  Service: Obstetrics;  Laterality: N/A;   HYSTEROSCOPY N/A 04/14/2014   Procedure: HYSTEROSCOPY, SUCTION D&C, RESECTION OF MYOMA;  Surgeon: Governor Specking, MD;  Location: [place];  Service: Gynecology;  Laterality: N/A;   LAPAROSCOPY N/A 04/14/2014   Procedure: LAPAROSCOPY, EXCISION AND ABLATION OF ENDOMETRIOSIS, CHROMOTUBATION;  Surgeon: Governor Specking, MD;  Location: [place];  Service: Gynecology;  Laterality: N/A;   TONSILLECTOMY  12-21-2003   12-27-2003--  CAUTERIZATION RIGHT TONSILLAR BLEED POST-OP    Family History  Problem Relation Age of Onset   Hypertension Paternal Grandmother    Stroke Paternal Grandmother    Stroke Mother    Hypertension Mother     Social History    Tobacco Use   Smoking status: Never   Smokeless tobacco: Never  Substance Use Topics   Alcohol use: No   Drug use: No    Prior to Admission medications   Medication Sig Start Date End Date Taking? Authorizing Provider  potassium chloride SA (KLOR-CON M) 20 MEQ tablet Take 1 tablet (20 mEq total) by mouth 2 (two) times daily. 09/08/21  Yes Lavora Brisbon, MD  hydrochlorothiazide (MICROZIDE) 12.5 MG capsule Take 12.5 mg by mouth daily.    [provider]  magnesium oxide (MAG-OX) 400 (241.3 Mg) MG tablet Take 1 tablet (400 mg total) by mouth daily. 09/29/16   Artelia Laroche, CNM  Multiple Vitamins-Minerals (MULTIVITAMIN WITH MINERALS) tablet Take 1 tablet by mouth daily.    [provider]  polyethylene glycol powder (GLYCOLAX/MIRALAX) powder Take 17 g by mouth daily. 02/25/18   Georgia Lopes, DO  Prenatal Vit-Fe Fumarate-FA (PRENATAL MULTIVITAMIN) TABS tablet Take 1 tablet by mouth daily at 12 noon.    [provider]  prochlorperazine (COMPAZINE) 10 MG tablet Take 1 tablet (10 mg total) by mouth 2 (two) times daily as needed for nausea or vomiting. 09/27/20   Fatima Blank, MD  Vitamins-Lipotropics (COMPLEX B-100 PO) Take by mouth.    [provider]    Allergies Penicillins   REVIEW OF SYSTEMS  Negative except as noted here or in the History of Present Illness.   PHYSICAL EXAMINATION  Initial Vital Signs Blood pressure 133/87,  pulse 77, temperature 98.4 F (36.9 C), temperature source Oral, resp. rate 15, SpO2 100 %, unknown if currently breastfeeding.  Examination General: Well-developed, well-nourished female in no acute distress; appearance consistent with age of record HENT: normocephalic; atraumatic Eyes: Normal appearance Neck: supple Heart: regular rate and rhythm Lungs: clear to auscultation bilaterally Abdomen: soft; nondistended; right upper quadrant tenderness; bowel sounds present Extremities: No deformity; full range of  motion; pulses normal Neurologic: Awake, alert and oriented; motor function intact in all extremities and symmetric; no facial droop Skin: Warm and dry Psychiatric: Grimacing   RESULTS  Summary of this visit's results, reviewed and interpreted by myself:   EKG Interpretation  Date/Time:    Ventricular Rate:    PR Interval:    QRS Duration:   QT Interval:    QTC Calculation:   R Axis:     Text Interpretation:         Laboratory Studies: Results for orders placed or performed during the hospital encounter of 09/07/21 (from the past 24 hour(s))  CBC with Differential     Status: Abnormal   Collection Time: 09/08/21 12:32 AM  Result Value Ref Range   WBC 14.5 (H) 4.0 - 10.5 K/uL   RBC 4.67 3.87 - 5.11 MIL/uL   Hemoglobin 13.6 12.0 - 15.0 g/dL   HCT 39.6 36.0 - 46.0 %   MCV 84.8 80.0 - 100.0 fL   MCH 29.1 26.0 - 34.0 pg   MCHC 34.3 30.0 - 36.0 g/dL   RDW 13.6 11.5 - 15.5 %   Platelets 329 150 - 400 K/uL   nRBC 0.0 0.0 - 0.2 %   Neutrophils Relative % 77 %   Neutro Abs 11.0 (H) 1.7 - 7.7 K/uL   Lymphocytes Relative 17 %   Lymphs Abs 2.5 0.7 - 4.0 K/uL   Monocytes Relative 5 %   Monocytes Absolute 0.8 0.1 - 1.0 K/uL   Eosinophils Relative 1 %   Eosinophils Absolute 0.1 0.0 - 0.5 K/uL   Basophils Relative 0 %   Basophils Absolute 0.0 0.0 - 0.1 K/uL   Immature Granulocytes 0 %   Abs Immature Granulocytes 0.05 0.00 - 0.07 K/uL  Urinalysis, Routine w reflex microscopic     Status: Abnormal   Collection Time: 09/08/21  1:42 AM  Result Value Ref Range   Color, Urine COLORLESS (A) YELLOW   APPearance CLEAR CLEAR   Specific Gravity, Urine 1.009 1.005 - 1.030   pH 6.5 5.0 - 8.0   Glucose, UA NEGATIVE NEGATIVE mg/dL   Hgb urine dipstick NEGATIVE NEGATIVE   Bilirubin Urine NEGATIVE NEGATIVE   Ketones, ur NEGATIVE NEGATIVE mg/dL   Protein, ur NEGATIVE NEGATIVE mg/dL   Nitrite NEGATIVE NEGATIVE   Leukocytes,Ua NEGATIVE NEGATIVE   RBC / HPF 0-5 0 - 5 RBC/hpf   WBC, UA 0-5  0 - 5 WBC/hpf   Squamous Epithelial / LPF 0-5 0 - 5   Imaging Studies: No results found.  ED COURSE and MDM  Nursing notes, initial and subsequent vitals signs, including pulse oximetry, reviewed and interpreted by myself.  Vitals:   09/07/21 2312 09/08/21 0017 09/08/21 0132  BP: 133/87 131/89 119/82  Pulse: 77 61 (!) 54  Resp: '15 16 16  '$ Temp: 98.4 F (36.9 C)    TempSrc: Oral    SpO2: 100% 98% 96%   Medications  metoCLOPramide (REGLAN) injection 10 mg (has no administration in time range)  potassium chloride (KLOR-CON) packet 40 mEq (40 mEq Oral Given 09/08/21 0159)  sodium chloride 0.9 % bolus 1,000 mL (1,000 mLs Intravenous New Bag/Given 09/08/21 0012)  ondansetron (ZOFRAN) injection 4 mg (4 mg Intravenous Given 09/08/21 0013)  HYDROmorphone (DILAUDID) injection 1 mg ( Intravenous Canceled Entry 09/08/21 0046)  ketorolac (TORADOL) 15 MG/ML injection 15 mg (15 mg Intravenous Given 09/08/21 0159)   2:09 AM Patient's pain and tenderness significantly improved.  Her potassium was noted to be 2.8 and we have started repletion.  This is likely due to GI losses.  She is scheduled for a gallbladder ultrasound later today.  She states she recently had significant weight loss which puts her at risk for development of gallstones.   PROCEDURES  Procedures   ED DIAGNOSES     ICD-10-CM   1. RUQ abdominal pain  R10.11     2. Hypokalemia due to excessive gastrointestinal loss of potassium  E87.6          Carliyah Cotterman, MD 09/08/21 [phone]

## 2021-09-07 NOTE — ED Triage Notes (Signed)
Pt via POV c/o abdominal pain with NVD ongoing since Saturday. Pt has schedule [place] tomorrow with Dr. Collene Mares GI concerning gallbladder. Pt states pain intolerable.

## 2021-09-08 ENCOUNTER — Ambulatory Visit (HOSPITAL_COMMUNITY)
Admission: RE | Admit: 2021-09-08 | Discharge: 2021-09-08 | Disposition: A | Discharge status: Home / Self Care | Payer: 59 | Source: Ambulatory Visit | Attending: Gastroenterology | Admitting: Gastroenterology

## 2021-09-08 ENCOUNTER — Ambulatory Visit: Payer: 59 | Admitting: Internal Medicine

## 2021-09-08 DIAGNOSIS — R1011 Right upper quadrant pain: Secondary | ICD-10-CM | POA: Insufficient documentation

## 2021-09-08 LAB — URINALYSIS, ROUTINE W REFLEX MICROSCOPIC
Bilirubin Urine: NEGATIVE
Glucose, UA: NEGATIVE mg/dL
Hgb urine dipstick: NEGATIVE
Ketones, ur: NEGATIVE mg/dL
Leukocytes,Ua: NEGATIVE
Nitrite: NEGATIVE
Protein, ur: NEGATIVE mg/dL
Specific Gravity, Urine: 1.009 (ref 1.005–1.030)
pH: 6.5 (ref 5.0–8.0)

## 2021-09-08 LAB — CBC WITH DIFFERENTIAL/PLATELET
Abs Immature Granulocytes: 0.05 10*3/uL (ref 0.00–0.07)
Basophils Absolute: 0 10*3/uL (ref 0.0–0.1)
Basophils Relative: 0 %
Eosinophils Absolute: 0.1 10*3/uL (ref 0.0–0.5)
Eosinophils Relative: 1 %
HCT: 39.6 % (ref 36.0–46.0)
Hemoglobin: 13.6 g/dL (ref 12.0–15.0)
Immature Granulocytes: 0 %
Lymphocytes Relative: 17 %
Lymphs Abs: 2.5 10*3/uL (ref 0.7–4.0)
MCH: 29.1 pg (ref 26.0–34.0)
MCHC: 34.3 g/dL (ref 30.0–36.0)
MCV: 84.8 fL (ref 80.0–100.0)
Monocytes Absolute: 0.8 10*3/uL (ref 0.1–1.0)
Monocytes Relative: 5 %
Neutro Abs: 11 10*3/uL — ABNORMAL HIGH (ref 1.7–7.7)
Neutrophils Relative %: 77 %
Platelets: 329 10*3/uL (ref 150–400)
RBC: 4.67 MIL/uL (ref 3.87–5.11)
RDW: 13.6 % (ref 11.5–15.5)
WBC: 14.5 10*3/uL — ABNORMAL HIGH (ref 4.0–10.5)
nRBC: 0 % (ref 0.0–0.2)

## 2021-09-08 LAB — COMPREHENSIVE METABOLIC PANEL
ALT: 10 U/L (ref 0–44)
AST: 10 U/L — ABNORMAL LOW (ref 15–41)
Albumin: 4 g/dL (ref 3.5–5.0)
Alkaline Phosphatase: 62 U/L (ref 38–126)
Anion gap: 9 (ref 5–15)
BUN: 7 mg/dL (ref 6–20)
CO2: 29 mmol/L (ref 22–32)
Calcium: 9.3 mg/dL (ref 8.9–10.3)
Chloride: 102 mmol/L (ref 98–111)
Creatinine, Ser: 0.98 mg/dL (ref 0.44–1.00)
GFR, Estimated: >60 mL/min (ref >60)
Glucose, Bld: 109 mg/dL — ABNORMAL HIGH (ref 70–99)
Potassium: 2.8 mmol/L — ABNORMAL LOW (ref 3.5–5.1)
Sodium: 140 mmol/L (ref 135–145)
Total Bilirubin: 0.4 mg/dL (ref 0.3–1.2)
Total Protein: 7.1 g/dL (ref 6.5–8.1)

## 2021-09-08 LAB — LIPASE, BLOOD: Lipase: 37 U/L (ref 11–51)

## 2021-09-08 LAB — HCG, SERUM, QUALITATIVE: Preg, Serum: NEGATIVE

## 2021-09-08 MED ORDER — POTASSIUM CHLORIDE 20 MEQ PO PACK
40.0000 meq | PACK | Freq: Every day | ORAL | Status: DC
  Administered 2021-09-08: 40 meq via ORAL
  Filled 2021-09-08: qty 2

## 2021-09-08 MED ORDER — POTASSIUM CHLORIDE CRYS ER 20 MEQ PO TBCR: 20.0000 meq | EXTENDED_RELEASE_TABLET | Freq: Two times a day (BID) | ORAL | 0 refills | Status: DC

## 2021-09-08 MED ORDER — KETOROLAC TROMETHAMINE 15 MG/ML IJ SOLN
15.0000 mg | Freq: Once | INTRAMUSCULAR | Status: AC
  Administered 2021-09-08: 15 mg via INTRAVENOUS
  Filled 2021-09-08: qty 1

## 2021-09-08 MED ORDER — HYDROMORPHONE HCL 1 MG/ML IJ SOLN
INTRAMUSCULAR | Status: AC
  Filled 2021-09-08: qty 1

## 2021-09-22 ENCOUNTER — Encounter (HOSPITAL_COMMUNITY): Payer: 59 | Attending: Gastroenterology

## 2021-11-01 ENCOUNTER — Ambulatory Visit: Payer: 59 | Admitting: Allergy

## 2021-11-01 ENCOUNTER — Encounter: Payer: Self-pay | Admitting: Allergy

## 2021-11-01 VITALS — BP 116/72 | HR 80 | Temp 98.7°F | Resp 18 | Ht 61.0 in | Wt 178.0 lb

## 2021-11-01 DIAGNOSIS — L508 Other urticaria: Secondary | ICD-10-CM

## 2021-11-01 DIAGNOSIS — T783XXD Angioneurotic edema, subsequent encounter: Secondary | ICD-10-CM | POA: Diagnosis not present

## 2021-11-01 MED ORDER — FAMOTIDINE 20 MG PO TABS: 20.0000 mg | ORAL_TABLET | Freq: Two times a day (BID) | ORAL | 5 refills | Status: AC

## 2021-11-01 MED ORDER — CETIRIZINE HCL 10 MG PO TABS: 10.0000 mg | ORAL_TABLET | Freq: Two times a day (BID) | ORAL | 5 refills | Status: AC

## 2021-11-01 NOTE — Patient Instructions (Addendum)
Chronic urticaria Angioedema   - at this time etiology of hives and swelling is unknown but is most likely spontaneous in nature.  Hives can be caused by a variety of different triggers including illness/infection, foods, medications, stings, exercise, pressure, vibrations, extremes of temperature to name a few however majority of the time there is no identifiable trigger.  Also discussed today histamine releasing agents which can include large quantities of strawberries, chocolate, NSAIDs and opiate medications.  Anything that activates your immune system can also drive hives which can include vaccinations.  Your symptoms have been ongoing for >6 weeks making this chronic thus will obtain labwork to evaluate: CBC w diff, CMP, tryptase, hive panel, environmental panel, alpha-gal panel  - for management of chronic hives recommend starting H1/H2 blocker regimen with Zyrtec + Pepcid once a day.  If still having hives on daily dosing then would recommend twice a day dosing.  If still having hives on twice a day dosing let us know as would add in Singulair to regimen.  If still having hives on triple therapy then Xolair would be advised to start monthly injections.  Xolair is indicated for treatment of chronic spontaneous hives.    - chronic spontaneous hives/swelling is usually not life threatening and thus does not usually need access to epipen device.  Will prescribed epipen however if alpha gal panel returns positive.    Follow-up in 2-3 months or sooner if needed  **We are ordering labs, so please allow 1-2 weeks for the results to come back.  With the Cures Act, the labs might be visible to you at the same time that they become visible to me.  However, I will not address the results until all of the results come back, so please be patient.

## 2021-11-01 NOTE — Progress Notes (Signed)
New Patient Note  RE: Marissa [name] MRN: [id number] DOB: 02-05-1976 Date of Office Visit: 11/01/2021  Primary care provider: Celene Squibb, MD  Chief Complaint: hives  History of present illness: Marissa [name] is a 46 y.o. female presenting today for evaluation of hives.    She has been having worsening hives over the past 4 months however she states she has been having intermittent hives at least the past 3 years.  3 years ago when first had hives she was doing keto diet and states she cheated and had grits one day and had hives that day.  She thought the hives she developed was related to the grits.  She states another time she had hives she had cheated again and had carb/starch food while doing keto diet.  However she has had other potential triggers.  Another episode she states a friend's son died and she had to pick out the funeral outfit and she had hives and attributed them to the stress.  Another episode of hives occurred after she had pizza. Most recently she got back from a cruise and recalls having [employer] and also states she took out her braids and she started itching from head down and had hives and facial swelling.  She couldn't even finish doing her hair due to the hives/swelling.   She also states when she has had alcohol like wines can have itching and hives of her hands.  Hives can last couple hours and does not leave any bruising marks behind.  After having intense itching and scratching can feel lightheaded however this is usually after taking benadryl.  She states she is quite sensitive to medications.  She will take kids dose of benadryl that does help hives and swelling. No fevers with hives/swelling as well as no joint aches/pain.  No new medications or foods prior to onset of hives.  Denies preceding illnesses. She did go to UC recently and did received systemic steroids for hive treatment.   She denies history of asthma, eczema, food allergy  or seasonal/perennial allergic rhinoconjunctivitis.   She reports a penicillin allergy from childhood and states had hives.  Has avoided penicillins since then.    Review of systems: Review of Systems  Constitutional: Negative.   HENT: Negative.    Eyes: Negative.   Respiratory: Negative.    Cardiovascular: Negative.   Gastrointestinal: Negative.   Musculoskeletal: Negative.   Skin:  Positive for rash.  Allergic/Immunologic: Negative.   Neurological: Negative.     All other systems negative unless noted above in HPI  Past medical history: Past Medical History:  Diagnosis Date   Back pain    Endometrial polyp    Endometriosis    Hip pain, bilateral    Hypertension    Urticaria     Past surgical history: Past Surgical History:  Procedure Laterality Date   CESAREAN SECTION  1995   CESAREAN SECTION N/A 09/26/2016   Procedure: CESAREAN SECTION;  Surgeon: Servando Salina, MD;  Location: [facility];  Service: Obstetrics;  Laterality: N/A;   HYSTEROSCOPY N/A 04/14/2014   Procedure: HYSTEROSCOPY, SUCTION D&C, RESECTION OF MYOMA;  Surgeon: Governor Specking, MD;  Location: [facility];  Service: Gynecology;  Laterality: N/A;   LAPAROSCOPY N/A 04/14/2014   Procedure: LAPAROSCOPY, EXCISION AND ABLATION OF ENDOMETRIOSIS, CHROMOTUBATION;  Surgeon: Governor Specking, MD;  Location: [facility];  Service: Gynecology;  Laterality: N/A;   TONSILLECTOMY  12-21-2003   12-27-2003--  CAUTERIZATION  RIGHT TONSILLAR BLEED POST-OP    Family history:  Family History  Problem Relation Age of Onset   Hypertension Paternal Grandmother    Stroke Paternal Grandmother    Stroke Mother    Hypertension Mother     Social history: Lives in a home without carpeting with electric heating and central cooling.  Dog in the home.  There is no concern for water damage, mildew or roaches in the home.  She is a Designer, jewellery.  She has no smoking  history.   Medication List: Current Outpatient Medications  Medication Sig Dispense Refill   buPROPion ER (WELLBUTRIN SR) 100 MG 12 hr tablet Take 100 mg by mouth daily.     cetirizine (ZYRTEC) 10 MG tablet Take 1 tablet (10 mg total) by mouth 2 (two) times daily. 60 tablet 5   famotidine (PEPCID) 20 MG tablet Take 1 tablet (20 mg total) by mouth 2 (two) times daily. 60 tablet 5   hydrochlorothiazide (MICROZIDE) 12.5 MG capsule Take 12.5 mg by mouth daily.     Multiple Vitamins-Minerals (MULTIVITAMIN WITH MINERALS) tablet Take 1 tablet by mouth daily.     potassium chloride SA (KLOR-CON M) 20 MEQ tablet Take 1 tablet (20 mEq total) by mouth 2 (two) times daily. 10 tablet 0   Prenatal Vit-Fe Fumarate-FA (PRENATAL MULTIVITAMIN) TABS tablet Take 1 tablet by mouth daily at 12 noon.     prochlorperazine (COMPAZINE) 10 MG tablet Take 1 tablet (10 mg total) by mouth 2 (two) times daily as needed for nausea or vomiting. 10 tablet 0   No current facility-administered medications for this visit.    Known medication allergies: Allergies  Allergen Reactions   Penicillins Hives    Has patient had a PCN reaction causing immediate rash, facial/tongue/throat swelling, SOB or lightheadedness with hypotension: No Has patient had a PCN reaction causing severe rash involving mucus membranes or skin necrosis: No Has patient had a PCN reaction that required hospitalization: No Has patient had a PCN reaction occurring within the last 10 years: No If all of the above answers are "NO", then may proceed with Cephalosporin use.      Physical examination: Blood pressure 116/72, pulse 80, temperature 98.7 F (37.1 C), resp. rate 18, height '5\' 1"'$  (1.549 m), weight 178 lb (80.7 kg), SpO2 98 %, unknown if currently breastfeeding.  General: Alert, interactive, in no acute distress. HEENT: PERRLA, TMs pearly gray, turbinates non-edematous without discharge, post-pharynx non erythematous. Neck: Supple without  lymphadenopathy. Lungs: Clear to auscultation without wheezing, rhonchi or rales. {no increased work of breathing. CV: Normal S1, S2 without murmurs. Abdomen: Nondistended, nontender. Skin: Warm and dry, without lesions or rashes. Extremities:  No clubbing, cyanosis or edema. Neuro:   Grossly intact.  Diagnositics/Labs: None today   Assessment and plan: Chronic urticaria Angioedema   - at this time etiology of hives and swelling is unknown but is most likely spontaneous in nature.  Hives can be caused by a variety of different triggers including illness/infection, foods, medications, stings, exercise, pressure, vibrations, extremes of temperature to name a few however majority of the time there is no identifiable trigger.  Also discussed today histamine releasing agents which can include large quantities of strawberries, chocolate, NSAIDs and opiate medications.  Anything that activates your immune system can also drive hives which can include vaccinations.  Your symptoms have been ongoing for >6 weeks making this chronic thus will obtain labwork to evaluate: CBC w diff, CMP, tryptase, hive panel, environmental panel, alpha-gal panel  -  for management of chronic hives recommend starting H1/H2 blocker regimen with Zyrtec + Pepcid once a day.  If still having hives on daily dosing then would recommend twice a day dosing.  If still having hives on twice a day dosing let us know as would add in Singulair to regimen.  If still having hives on triple therapy then Xolair would be advised to start monthly injections.  Xolair is indicated for treatment of chronic spontaneous hives.    - chronic spontaneous hives/swelling is usually not life threatening and thus does not usually need access to epipen device.  Will prescribed epipen however if alpha gal panel returns positive.    Penicillin Allergy - recommended graded oral challenge in office.  Will need to be off antihistamines for at least 3 days prior  to performing this.    Follow-up in 2-3 months or sooner if needed  I appreciate the opportunity to take part in Indea's care. Please do not hesitate to contact me with questions.  Sincerely,   Prudy Feeler, MD Allergy/Immunology [facility]

## 2021-11-08 LAB — ALLERGENS W/TOTAL IGE AREA 2
Alternaria Alternata IgE: <0.1 kU/L
Aspergillus Fumigatus IgE: <0.1 kU/L
Bermuda Grass IgE: <0.1 kU/L
Cat Dander IgE: <0.1 kU/L
Cedar, Mountain IgE: <0.1 kU/L
Cladosporium Herbarum IgE: <0.1 kU/L
Cockroach, German IgE: <0.1 kU/L
Common Silver Birch IgE: <0.1 kU/L
Cottonwood IgE: <0.1 kU/L
D Farinae IgE: <0.1 kU/L
D Pteronyssinus IgE: <0.1 kU/L
Dog Dander IgE: 4.13 kU/L — AB
Elm, American IgE: <0.1 kU/L
Johnson Grass IgE: <0.1 kU/L
Maple/Box Elder IgE: <0.1 kU/L
Mouse Urine IgE: <0.1 kU/L
Oak, White IgE: <0.1 kU/L
Pecan, Hickory IgE: <0.1 kU/L
Penicillium Chrysogen IgE: <0.1 kU/L
Pigweed, Rough IgE: <0.1 kU/L
Ragweed, Short IgE: <0.1 kU/L
Sheep Sorrel IgE Qn: <0.1 kU/L
Timothy Grass IgE: <0.1 kU/L
White Mulberry IgE: <0.1 kU/L

## 2021-11-08 LAB — CBC WITH DIFFERENTIAL
Basophils Absolute: 0.1 10*3/uL (ref 0.0–0.2)
Basos: 1 %
EOS (ABSOLUTE): 0.2 10*3/uL (ref 0.0–0.4)
Eos: 3 %
Hematocrit: 40.8 % (ref 34.0–46.6)
Hemoglobin: 13.3 g/dL (ref 11.1–15.9)
Immature Grans (Abs): 0 10*3/uL (ref 0.0–0.1)
Immature Granulocytes: 0 %
Lymphocytes Absolute: 2.5 10*3/uL (ref 0.7–3.1)
Lymphs: 28 %
MCH: 29.6 pg (ref 26.6–33.0)
MCHC: 32.6 g/dL (ref 31.5–35.7)
MCV: 91 fL (ref 79–97)
Monocytes Absolute: 0.5 10*3/uL (ref 0.1–0.9)
Monocytes: 5 %
Neutrophils Absolute: 5.8 10*3/uL (ref 1.4–7.0)
Neutrophils: 63 %
RBC: 4.5 x10E6/uL (ref 3.77–5.28)
RDW: 13.7 % (ref 11.7–15.4)
WBC: 9.1 10*3/uL (ref 3.4–10.8)

## 2021-11-08 LAB — THYROID ANTIBODIES
Thyroglobulin Antibody: <1 IU/mL (ref 0.0–0.9)
Thyroperoxidase Ab SerPl-aCnc: <9 IU/mL (ref 0–34)

## 2021-11-08 LAB — COMPREHENSIVE METABOLIC PANEL
ALT: 9 IU/L (ref 0–32)
AST: 12 IU/L (ref 0–40)
Albumin/Globulin Ratio: 1.4 (ref 1.2–2.2)
Albumin: 4.2 g/dL (ref 3.9–4.9)
Alkaline Phosphatase: 98 IU/L (ref 44–121)
BUN/Creatinine Ratio: 10 (ref 9–23)
BUN: 10 mg/dL (ref 6–24)
Bilirubin Total: 0.2 mg/dL (ref 0.0–1.2)
CO2: 25 mmol/L (ref 20–29)
Calcium: 9.5 mg/dL (ref 8.7–10.2)
Chloride: 103 mmol/L (ref 96–106)
Creatinine, Ser: 0.96 mg/dL (ref 0.57–1.00)
Globulin, Total: 3 g/dL (ref 1.5–4.5)
Glucose: 60 mg/dL — ABNORMAL LOW (ref 70–99)
Potassium: 3.6 mmol/L (ref 3.5–5.2)
Sodium: 143 mmol/L (ref 134–144)
Total Protein: 7.2 g/dL (ref 6.0–8.5)
eGFR: 75 mL/min/{1.73_m2} (ref >59)

## 2021-11-08 LAB — TRYPTASE: Tryptase: 5.8 ug/L (ref 2.2–13.2)

## 2021-11-08 LAB — ALPHA-GAL PANEL
Allergen Lamb IgE: <0.1 kU/L
Beef IgE: <0.1 kU/L
IgE (Immunoglobulin E), Serum: 592 IU/mL — ABNORMAL HIGH (ref 6–495)
O215-IgE Alpha-Gal: <0.1 kU/L
Pork IgE: <0.1 kU/L

## 2021-11-08 LAB — CHRONIC URTICARIA: cu index: <3.2 (ref <10)

## 2022-02-08 ENCOUNTER — Ambulatory Visit: Payer: 59 | Admitting: Allergy

## 2022-02-08 DIAGNOSIS — J309 Allergic rhinitis, unspecified: Secondary | ICD-10-CM

## 2022-05-10 ENCOUNTER — Encounter: Payer: Self-pay | Admitting: Cardiology

## 2022-05-10 ENCOUNTER — Ambulatory Visit: Payer: 59 | Admitting: Cardiology

## 2022-05-10 VITALS — BP 133/87 | HR 75 | Resp 16 | Ht 61.0 in | Wt 174.2 lb

## 2022-05-10 DIAGNOSIS — I10 Essential (primary) hypertension: Secondary | ICD-10-CM

## 2022-05-10 DIAGNOSIS — R55 Syncope and collapse: Secondary | ICD-10-CM

## 2022-05-10 NOTE — Progress Notes (Signed)
Primary Physician/Referring:  Celene Squibb, MD  Patient ID: Leanord Hawking, female    DOB: May 23, 1975, 47 y.o.   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  Her past medical history is significant for obesity, she is presently on Mounjaro with significant weight loss, also about 3 to 4 years ago she went on a keto diet, when she switched back to regular diet, she started developing skin rash, episodes of flushing, diarrhea.  Due to 2 episodes of syncope that occurred in a few minutes back while she was in [place] on vacation, she was referred to [place] by Dr. Wende Neighbors and presents to establish care.  Otherwise patient is active, she has never had any syncope in the past, no family history of sudden cardiac death.   Past Medical History:  Diagnosis Date   Back pain    Endometrial polyp    Endometriosis    Hip pain, bilateral    Hypertension    Urticaria    Past Surgical History:  Procedure Laterality Date   CESAREAN SECTION  1995   CESAREAN SECTION N/A 09/26/2016   Procedure: CESAREAN SECTION;  Surgeon: Servando Salina, MD;  Location: [facility];  Service: Obstetrics;  Laterality: N/A;   HYSTEROSCOPY N/A 04/14/2014   Procedure: HYSTEROSCOPY, SUCTION D&C, RESECTION OF MYOMA;  Surgeon: Governor Specking, MD;  Location: [facility];  Service: Gynecology;  Laterality: N/A;   LAPAROSCOPY N/A 04/14/2014   Procedure: LAPAROSCOPY, EXCISION AND ABLATION OF ENDOMETRIOSIS, CHROMOTUBATION;  Surgeon: Governor Specking, MD;  Location: [facility];  Service: Gynecology;  Laterality: N/A;   TONSILLECTOMY  12-21-2003   12-27-2003--  CAUTERIZATION RIGHT TONSILLAR BLEED POST-OP   Family History  Problem Relation  Age of Onset   Hypertension Paternal Grandmother    Stroke Paternal Grandmother    Stroke Mother    Hypertension Mother     Social History   Tobacco Use   Smoking status: Never   Smokeless tobacco: Never  Substance Use Topics   Alcohol use: Yes    Alcohol/week: 1.0 standard drink of alcohol    Types: 1 Glasses of wine per week   Marital Status: Married  ROS  Review of Systems  Cardiovascular:  Positive for syncope. Negative for chest pain, dyspnea on exertion and leg swelling.   Objective      05/10/2022    3:33 PM 11/01/2021    8:42 AM 09/08/2021    2:00 AM  Vitals with BMI  Height 5' 1"$  5' 1"$    Weight 174 lbs 3 oz 178 lbs   BMI [id number]   Systolic [id number]  Diastolic 87 72 88  Pulse 75 80 69   SpO2: 98 % Orthostatic VS for the past 72 hrs (Last 3 readings):  Orthostatic BP Patient Position BP Location Cuff Size Orthostatic Pulse  05/10/22 1537 129/89 Standing Left Arm Normal 79  05/10/22 1535 (!) 130/92 Sitting Left Arm Normal 73  05/10/22 1534 (!) 142/96 Supine Left Arm Normal 82     Physical Exam Constitutional:      Appearance: She is obese.  Neck:     Vascular: No carotid bruit or JVD.  Cardiovascular:     Rate and  Rhythm: Normal rate and regular rhythm.     Pulses: Intact distal pulses.     Heart sounds: Normal heart sounds. No murmur heard.    No gallop.  Pulmonary:     Effort: Pulmonary effort is normal.     Breath sounds: Normal breath sounds.  Abdominal:     General: Bowel sounds are normal.     Palpations: Abdomen is soft.  Musculoskeletal:     Right lower leg: No edema.     Left lower leg: No edema.     Medications and allergies   Allergies  Allergen Reactions   Penicillins Hives    Has patient had a PCN reaction causing immediate rash, facial/tongue/throat swelling, SOB or lightheadedness with hypotension: No Has patient had a PCN reaction causing severe rash involving mucus membranes or skin necrosis: No Has patient had a PCN  reaction that required hospitalization: No Has patient had a PCN reaction occurring within the last 10 years: No If all of the above answers are "NO", then may proceed with Cephalosporin use.      Medication list   Current Outpatient Medications:    buPROPion ER (WELLBUTRIN SR) 100 MG 12 hr tablet, Take 100 mg by mouth daily., Disp: , Rfl:    cetirizine (ZYRTEC) 10 MG tablet, Take 1 tablet (10 mg total) by mouth 2 (two) times daily., Disp: 60 tablet, Rfl: 5   famotidine (PEPCID) 20 MG tablet, Take 1 tablet (20 mg total) by mouth 2 (two) times daily., Disp: 60 tablet, Rfl: 5   hydrochlorothiazide (MICROZIDE) 12.5 MG capsule, Take 12.5 mg by mouth daily., Disp: , Rfl:    Multiple Vitamins-Minerals (MULTIVITAMIN WITH MINERALS) tablet, Take 1 tablet by mouth daily., Disp: , Rfl:    potassium chloride SA (KLOR-CON M) 20 MEQ tablet, Take 1 tablet (20 mEq total) by mouth 2 (two) times daily., Disp: 10 tablet, Rfl: 0   tirzepatide (MOUNJARO) 5 MG/0.5ML Pen, Inject 5 mg into the skin once a week., Disp: , Rfl:  Laboratory examination:   Recent Labs    09/07/21 0032 11/01/21 1552  NA 140 143  K 2.8* 3.6  CL 102 103  CO2 29 25  GLUCOSE 109* 60*  BUN 7 10  CREATININE 0.98 0.96  CALCIUM 9.3 9.5  GFRNONAA >60  --     Lab Results  Component Value Date   GLUCOSE 60 (L) 11/01/2021   NA 143 11/01/2021   K 3.6 11/01/2021   CL 103 11/01/2021   CO2 25 11/01/2021   BUN 10 11/01/2021   CREATININE 0.96 11/01/2021   EGFR 75 11/01/2021   CALCIUM 9.5 11/01/2021   PROT 7.2 11/01/2021   ALBUMIN 4.2 11/01/2021   LABGLOB 3.0 11/01/2021   AGRATIO 1.4 11/01/2021   BILITOT 0.2 11/01/2021   ALKPHOS 98 11/01/2021   AST 12 11/01/2021   ALT 9 11/01/2021   ANIONGAP 9 09/07/2021      Lab Results  Component Value Date   ALT 9 11/01/2021   AST 12 11/01/2021   ALKPHOS 98 11/01/2021   BILITOT 0.2 11/01/2021       Latest Ref Rng & Units 11/01/2021    3:52 PM 09/07/2021   12:32 AM 09/27/2020    2:52  AM  Hepatic Function  Total Protein 6.0 - 8.5 g/dL 7.2  7.1  8.4   Albumin 3.9 - 4.9 g/dL 4.2  4.0  4.5   AST 0 - 40 IU/L 12  10  12   $ ALT 0 -  32 IU/L 9  10  12   $ Alk Phosphatase 44 - 121 IU/L 98  62  89   Total Bilirubin 0.0 - 1.2 mg/dL 0.2  0.4  0.4    Lab Results  Component Value Date   CHOL 179 01/29/2018   HDL 44 01/29/2018   LDLCALC 121 (H) 01/29/2018   LDLDIRECT 114 (H) 05/01/2013   TRIG 70 01/29/2018    HEMOGLOBIN A1C Lab Results  Component Value Date   HGBA1C 5.4 01/29/2018   Lab Results  Component Value Date   TSH 2.510 01/29/2018    External labs:   Cholesterol, total 174.000 M 08/14/2018 HDL 44.000 MG 08/14/2018 LDL 116.000 M 08/14/2018 Triglycerides 69.000 MG 08/14/2018  TSH 2.630 08/14/2018   Radiology:    Cardiac Studies:   EKG:   Normal, seasonal EKG 05/10/2022: Normal sinus rhythm with rate of 75 bpm, normal axis, no evidence of ischemia, normal EKG    Assessment     ICD-10-CM   1. Neurocardiogenic syncope  R55 EKG 12-Lead    PCV ECHOCARDIOGRAM COMPLETE    2. Primary hypertension  I10        Orders Placed This Encounter  Procedures   EKG 12-Lead   PCV ECHOCARDIOGRAM COMPLETE    Standing Status:   Future    Standing Expiration Date:   05/11/2023    No orders of the defined types were placed in this encounter.   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  Her past medical history is significant for obesity, she is presently on Mounjaro with significant weight loss, also about 3 to 4 years ago she went on a keto diet, when she switched back to regular diet, she started developing skin rash, episodes of  flushing, diarrhea.  Neurocardiogenic syncope Patient was on medication and had red meat which at times induces rash, flushing and diarrhea, also alcohol worsens the symptoms, as she returned to the room, started noticing hives coming back up and she was extremely hot and fight diaphoretic, she decided to take warm shower and fortunately husband present in the shower and she had a syncopal spell.  She may have had transient seizure-like movements, however she got up from the floor and try to get back onto the bed but had a second episode of syncope.  Immediately she was able to get up and go to the bed and she was able to remember.  Her symptoms are classic for neurocardiogenic syncope/vasovagal syncope.  Patient's symptoms of paroxysmal episodes of flushing either related to certain foods, alcohol, sometimes unexplained, lead to her profound symptoms of orthostasis followed by syncope especially when she got exposed to hot water.  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  Fall precautions discussed.  Her symptoms of marked skin rash, itchiness, associated diarrhea suggests excess histamine release and wonder whether she needs workup carcinoid syndrome although appears unlikely or she may have carcinoid-like syndrome.  I will obtain an echocardiogram to exclude any tricuspid valve pathology.  Unless abnormal, I will  see her back on a as needed basis.  Primary hypertension: Blood pressure slightly elevated, I would consider starting her on ARB with HCTZ combination and following the renal function closely.  This can be done in the outpatient basis with the PCP as I am seeing her back only on a as needed basis.  If indeed she develops significant renal dysfunction on ARB, she needs to be evaluated for  FMD.   Adrian Prows, MD, [facility] 05/10/2022, 4:49 PM Office: [phone]

## 2022-05-18 ENCOUNTER — Ambulatory Visit: Payer: 59

## 2022-05-18 DIAGNOSIS — R55 Syncope and collapse: Secondary | ICD-10-CM

## 2022-08-23 ENCOUNTER — Ambulatory Visit (HOSPITAL_COMMUNITY): Payer: 59

## 2022-09-04 ENCOUNTER — Observation Stay (HOSPITAL_COMMUNITY)
Admission: EM | Admit: 2022-09-04 | Discharge: 2022-09-05 | Disposition: A | Discharge status: Home / Self Care | Payer: 59 | Attending: Internal Medicine | Admitting: Internal Medicine

## 2022-09-04 ENCOUNTER — Other Ambulatory Visit: Payer: Self-pay

## 2022-09-04 ENCOUNTER — Emergency Department (HOSPITAL_COMMUNITY): Payer: 59

## 2022-09-04 ENCOUNTER — Encounter (HOSPITAL_COMMUNITY): Payer: Self-pay | Admitting: Internal Medicine

## 2022-09-04 ENCOUNTER — Observation Stay (HOSPITAL_COMMUNITY): Payer: 59

## 2022-09-04 ENCOUNTER — Observation Stay (HOSPITAL_BASED_OUTPATIENT_CLINIC_OR_DEPARTMENT_OTHER): Payer: 59

## 2022-09-04 DIAGNOSIS — Z79899 Other long term (current) drug therapy: Secondary | ICD-10-CM | POA: Insufficient documentation

## 2022-09-04 DIAGNOSIS — Z7985 Long-term (current) use of injectable non-insulin antidiabetic drugs: Secondary | ICD-10-CM | POA: Insufficient documentation

## 2022-09-04 DIAGNOSIS — M79662 Pain in left lower leg: Secondary | ICD-10-CM | POA: Insufficient documentation

## 2022-09-04 DIAGNOSIS — I10 Essential (primary) hypertension: Secondary | ICD-10-CM | POA: Insufficient documentation

## 2022-09-04 DIAGNOSIS — E785 Hyperlipidemia, unspecified: Secondary | ICD-10-CM | POA: Diagnosis not present

## 2022-09-04 DIAGNOSIS — R55 Syncope and collapse: Principal | ICD-10-CM | POA: Insufficient documentation

## 2022-09-04 DIAGNOSIS — I639 Cerebral infarction, unspecified: Secondary | ICD-10-CM

## 2022-09-04 DIAGNOSIS — I6389 Other cerebral infarction: Secondary | ICD-10-CM

## 2022-09-04 LAB — COMPREHENSIVE METABOLIC PANEL
ALT: 15 U/L (ref 0–44)
AST: 17 U/L (ref 15–41)
Albumin: 4.3 g/dL (ref 3.5–5.0)
Alkaline Phosphatase: 89 U/L (ref 38–126)
Anion gap: 9 (ref 5–15)
BUN: 8 mg/dL (ref 6–20)
CO2: 24 mmol/L (ref 22–32)
Calcium: 9.3 mg/dL (ref 8.9–10.3)
Chloride: 104 mmol/L (ref 98–111)
Creatinine, Ser: 0.97 mg/dL (ref 0.44–1.00)
GFR, Estimated: >60 mL/min (ref >60)
Glucose, Bld: 88 mg/dL (ref 70–99)
Potassium: 3.3 mmol/L — ABNORMAL LOW (ref 3.5–5.1)
Sodium: 137 mmol/L (ref 135–145)
Total Bilirubin: 0.6 mg/dL (ref 0.3–1.2)
Total Protein: 8 g/dL (ref 6.5–8.1)

## 2022-09-04 LAB — SEDIMENTATION RATE: Sed Rate: 11 mm/hr (ref 0–22)

## 2022-09-04 LAB — CBC WITH DIFFERENTIAL/PLATELET
Abs Immature Granulocytes: 0.02 10*3/uL (ref 0.00–0.07)
Basophils Absolute: 0.1 10*3/uL (ref 0.0–0.1)
Basophils Relative: 1 %
Eosinophils Absolute: 0.3 10*3/uL (ref 0.0–0.5)
Eosinophils Relative: 4 %
HCT: 43.8 % (ref 36.0–46.0)
Hemoglobin: 14.5 g/dL (ref 12.0–15.0)
Immature Granulocytes: 0 %
Lymphocytes Relative: 28 %
Lymphs Abs: 2.2 10*3/uL (ref 0.7–4.0)
MCH: 29.1 pg (ref 26.0–34.0)
MCHC: 33.1 g/dL (ref 30.0–36.0)
MCV: 88 fL (ref 80.0–100.0)
Monocytes Absolute: 0.4 10*3/uL (ref 0.1–1.0)
Monocytes Relative: 6 %
Neutro Abs: 4.8 10*3/uL (ref 1.7–7.7)
Neutrophils Relative %: 61 %
Platelets: 286 10*3/uL (ref 150–400)
RBC: 4.98 MIL/uL (ref 3.87–5.11)
RDW: 14 % (ref 11.5–15.5)
WBC: 7.8 10*3/uL (ref 4.0–10.5)
nRBC: 0 % (ref 0.0–0.2)

## 2022-09-04 LAB — TROPONIN I (HIGH SENSITIVITY)
Troponin I (High Sensitivity): 5 ng/L (ref <18)
Troponin I (High Sensitivity): 6 ng/L (ref <18)

## 2022-09-04 LAB — ECHOCARDIOGRAM COMPLETE
AR max vel: 2.92 cm2
AV Area VTI: 2.91 cm2
AV Area mean vel: 2.92 cm2
AV Mean grad: 2 mmHg
AV Peak grad: 4.4 mmHg
Ao pk vel: 1.05 m/s
Area-P 1/2: 3.34 cm2
S' Lateral: 2.4 cm

## 2022-09-04 LAB — ANTITHROMBIN III: AntiThromb III Func: 110 % (ref 75–120)

## 2022-09-04 LAB — URINALYSIS, ROUTINE W REFLEX MICROSCOPIC
Bilirubin Urine: NEGATIVE
Glucose, UA: NEGATIVE mg/dL
Hgb urine dipstick: NEGATIVE
Ketones, ur: NEGATIVE mg/dL
Leukocytes,Ua: NEGATIVE
Nitrite: NEGATIVE
Protein, ur: NEGATIVE mg/dL
Specific Gravity, Urine: 1.004 — ABNORMAL LOW (ref 1.005–1.030)
pH: 6 (ref 5.0–8.0)

## 2022-09-04 LAB — HEMOGLOBIN A1C
Hgb A1c MFr Bld: 4.7 % — ABNORMAL LOW (ref 4.8–5.6)
Mean Plasma Glucose: 88.19 mg/dL

## 2022-09-04 LAB — POC URINE PREG, ED: Preg Test, Ur: NEGATIVE

## 2022-09-04 LAB — CBG MONITORING, ED: Glucose-Capillary: 91 mg/dL (ref 70–99)

## 2022-09-04 LAB — VITAMIN B12: Vitamin B-12: 2224 pg/mL — ABNORMAL HIGH (ref 180–914)

## 2022-09-04 LAB — TSH: TSH: 2.978 u[IU]/mL (ref 0.350–4.500)

## 2022-09-04 LAB — FOLATE: Folate: 27.7 ng/mL (ref >5.9)

## 2022-09-04 LAB — HIV ANTIBODY (ROUTINE TESTING W REFLEX): HIV Screen 4th Generation wRfx: NONREACTIVE

## 2022-09-04 MED ORDER — ACETAMINOPHEN 325 MG PO TABS
650.0000 mg | ORAL_TABLET | ORAL | Status: DC | PRN
  Administered 2022-09-04: 650 mg via ORAL
  Filled 2022-09-04: qty 2

## 2022-09-04 MED ORDER — CLOPIDOGREL BISULFATE 75 MG PO TABS
75.0000 mg | ORAL_TABLET | Freq: Every day | ORAL | Status: DC
  Administered 2022-09-04 – 2022-09-05 (×2): 75 mg via ORAL
  Filled 2022-09-04 (×2): qty 1

## 2022-09-04 MED ORDER — ACETAMINOPHEN 650 MG RE SUPP: 650.0000 mg | RECTAL | Status: DC | PRN

## 2022-09-04 MED ORDER — ASPIRIN 81 MG PO CHEW
81.0000 mg | CHEWABLE_TABLET | Freq: Every day | ORAL | Status: DC
  Administered 2022-09-05: 81 mg via ORAL
  Filled 2022-09-04: qty 1

## 2022-09-04 MED ORDER — GADOBUTROL 1 MMOL/ML IV SOLN
7.0000 mL | Freq: Once | INTRAVENOUS | Status: AC | PRN
  Administered 2022-09-04: 7 mL via INTRAVENOUS

## 2022-09-04 MED ORDER — ACETAMINOPHEN 160 MG/5ML PO SOLN: 650.0000 mg | ORAL | Status: DC | PRN

## 2022-09-04 MED ORDER — ASPIRIN 81 MG PO CHEW
324.0000 mg | CHEWABLE_TABLET | Freq: Once | ORAL | Status: AC
  Administered 2022-09-04: 324 mg via ORAL
  Filled 2022-09-04: qty 4

## 2022-09-04 MED ORDER — STROKE: EARLY STAGES OF RECOVERY BOOK
Freq: Once | Status: AC
  Filled 2022-09-04: qty 1

## 2022-09-04 MED ORDER — ENOXAPARIN SODIUM 40 MG/0.4ML IJ SOSY
40.0000 mg | PREFILLED_SYRINGE | INTRAMUSCULAR | Status: DC
  Administered 2022-09-04 – 2022-09-05 (×2): 40 mg via SUBCUTANEOUS
  Filled 2022-09-04: qty 0.4

## 2022-09-04 MED ORDER — POTASSIUM CHLORIDE CRYS ER 20 MEQ PO TBCR
40.0000 meq | EXTENDED_RELEASE_TABLET | Freq: Once | ORAL | Status: AC
  Administered 2022-09-04: 40 meq via ORAL
  Filled 2022-09-04: qty 2

## 2022-09-04 MED ORDER — SENNOSIDES-DOCUSATE SODIUM 8.6-50 MG PO TABS: 1.0000 | ORAL_TABLET | Freq: Every evening | ORAL | Status: DC | PRN

## 2022-09-04 NOTE — Plan of Care (Signed)
  Problem: Education: Goal: Knowledge of disease or condition will improve Outcome: Progressing Goal: Knowledge of secondary prevention will improve (MUST DOCUMENT ALL) Outcome: Progressing Goal: Knowledge of patient specific risk factors will improve (Mark N/A or DELETE if not current risk factor) Outcome: Progressing   Problem: Ischemic Stroke/TIA Tissue Perfusion: Goal: Complications of ischemic stroke/TIA will be minimized Outcome: Progressing   Problem: Coping: Goal: Will verbalize positive feelings about self Outcome: Progressing Goal: Will identify appropriate support needs Outcome: Progressing   Problem: Health Behavior/Discharge Planning: Goal: Ability to manage health-related needs will improve Outcome: Progressing Goal: Goals will be collaboratively established with patient/family Outcome: Progressing   Problem: Self-Care: Goal: Ability to participate in self-care as condition permits will improve Outcome: Progressing Goal: Verbalization of feelings and concerns over difficulty with self-care will improve Outcome: Progressing Goal: Ability to communicate needs accurately will improve Outcome: Progressing   Problem: Nutrition: Goal: Risk of aspiration will decrease Outcome: Progressing Goal: Dietary intake will improve Outcome: Progressing   Problem: Education: Goal: Knowledge of General Education information will improve Description: Including pain rating scale, medication(s)/side effects and non-pharmacologic comfort measures Outcome: Progressing   Problem: Health Behavior/Discharge Planning: Goal: Ability to manage health-related needs will improve Outcome: Progressing   

## 2022-09-04 NOTE — ED Triage Notes (Addendum)
Pt. Stated, I had syncope episodes in February while in [place] when I was in the shower. I went to cardiologist and did an echo and it was normal . Was sent to a neurologist 2 weeks ago. and had a MRI and told me I could have had a stroke old or new didn't know. My concern is I have once in awhile memory. Its called the memory dysphagia.  VAN - negative.

## 2022-09-04 NOTE — Plan of Care (Cosign Needed)
  [facility] called for powershare request for MRI brain and Carotid US  [name] MRN is [id number]  [name] FNP-BC [facility]

## 2022-09-04 NOTE — ED Provider Notes (Signed)
[place] EMERGENCY DEPARTMENT AT [facility] Provider Note   CSN: [id number] Arrival date & time: 09/04/22  8295     History  Chief Complaint  Patient presents with   Near Syncope   abnormal MRI    Marissa [name] is a 47 y.o. female history of hypertension presented with an abnormal MRI after having an episode of syncope back in February.  Patient states she was in [place] when she had a syncopal episode in the shower.  Patient states she felt warm all of a sudden and had hives and was out for approximately 4 minutes when her significant other found her.  Patient does not remember if she was confused afterwards.  Patient states that she had seen cardiology and was diagnosed with vasovagal syncope and was at her primary care's office yesterday when she had an MRI done.  Patient stated that she received a phone call earlier this morning notes that she had an abnormal MRI that could not show any acute stroke and was referred to ED.  Patient states since February she has not had any syncopal episodes and is currently asymptomatic.  MR brain without contrast 09/04/2022 small focus of diffusion restriction in right frontoparietal region concerning for very small recent infarct-[facility]  Patient denied chest pain, shortness of breath, nausea/vomiting, abdominal pain, fever, recent illness, abdominal pain, dysuria, headache, vision changes, neck pain  Home Medications Prior to Admission medications   Medication Sig Start Date End Date Taking? Authorizing Provider  buPROPion ER (WELLBUTRIN SR) 100 MG 12 hr tablet Take 100 mg by mouth daily. 09/18/21   [provider]  cetirizine (ZYRTEC) 10 MG tablet Take 1 tablet (10 mg total) by mouth 2 (two) times daily. 11/01/21   Marcelyn Bruins, MD  famotidine (PEPCID) 20 MG tablet Take 1 tablet (20 mg total) by mouth 2 (two) times daily. 11/01/21   Marcelyn Bruins, MD  hydrochlorothiazide (MICROZIDE)  12.5 MG capsule Take 12.5 mg by mouth daily.    [provider]  Multiple Vitamins-Minerals (MULTIVITAMIN WITH MINERALS) tablet Take 1 tablet by mouth daily.    [provider]  potassium chloride SA (KLOR-CON M) 20 MEQ tablet Take 1 tablet (20 mEq total) by mouth 2 (two) times daily. 09/08/21   Molpus, John, MD  tirzepatide [facility]) 5 MG/0.5ML Pen Inject 5 mg into the skin once a week. 08/07/21   [provider]      Allergies    Penicillins    Review of Systems   Review of Systems See HPI Physical Exam Updated Vital Signs BP (!) 138/94   Pulse 81   Temp 98.2 F (36.8 C) (Oral)   Resp 15   SpO2 100%  Physical Exam Constitutional:      General: She is not in acute distress. HENT:     Right Ear: Tympanic membrane, ear canal and external ear normal.     Left Ear: Tympanic membrane, ear canal and external ear normal.  Eyes:     Extraocular Movements: Extraocular movements intact.     Conjunctiva/sclera: Conjunctivae normal.     Pupils: Pupils are equal, round, and reactive to light.  Cardiovascular:     Rate and Rhythm: Normal rate and regular rhythm.     Pulses: Normal pulses.     Heart sounds: Normal heart sounds.  Pulmonary:     Effort: Pulmonary effort is normal. No respiratory distress.     Breath sounds: Normal breath sounds.  Abdominal:  Palpations: Abdomen is soft.     Tenderness: There is no abdominal tenderness. There is no guarding or rebound.  Musculoskeletal:        General: Normal range of motion.     Cervical back: Normal range of motion.  Skin:    General: Skin is warm and dry.     Capillary Refill: Capillary refill takes less than 2 seconds.  Neurological:     General: No focal deficit present.     Mental Status: She is alert and oriented to person, place, and time.     Sensory: Sensation is intact.     Motor: Motor function is intact.     Coordination: Coordination is intact.     Gait: Gait is intact.     Comments:  Sensation intact in all 4 limbs Vision grossly intact Cranial nerves III through XII intact  Psychiatric:        Mood and Affect: Mood normal.     ED Results / Procedures / Treatments   Labs (all labs ordered are listed, but only abnormal results are displayed) Labs Reviewed  COMPREHENSIVE METABOLIC PANEL - Abnormal; Notable for the following components:      Result Value   Potassium 3.3 (*)    All other components within normal limits  URINALYSIS, ROUTINE W REFLEX MICROSCOPIC - Abnormal; Notable for the following components:   Color, Urine STRAW (*)    Specific Gravity, Urine 1.004 (*)    All other components within normal limits  CBC WITH DIFFERENTIAL/PLATELET  HEMOGLOBIN A1C  CBG MONITORING, ED  POC URINE PREG, ED  TROPONIN I (HIGH SENSITIVITY)    EKG EKG Interpretation  Date/Time:  Tuesday September 04 2022 09:15:01 EDT Ventricular Rate:  75 PR Interval:  167 QRS Duration: 103 QT Interval:  413 QTC Calculation: 462 R Axis:   -37 Text Interpretation: Sinus rhythm Left axis deviation Confirmed by Ernie Avena ([id number]) on 09/04/2022 9:20:17 AM  Radiology No results found.  Procedures .Critical Care  Performed by: Netta Corrigan, PA-C Authorized by: Netta Corrigan, PA-C   Critical care provider statement:    Critical care time (minutes):  30   Critical care was necessary to treat or prevent imminent or life-threatening deterioration of the following conditions: tPA evaluation.   Critical care was time spent personally by me on the following activities:  Review of old charts, re-evaluation of patient's condition, pulse oximetry, ordering and review of radiographic studies, ordering and review of laboratory studies, ordering and performing treatments and interventions, obtaining history from patient or surrogate, examination of patient, evaluation of patient's response to treatment and discussions with consultants   I assumed direction of critical care for this patient  from another provider in my specialty: no     Care discussed with comment:  Neurology     Medications Ordered in ED Medications   stroke: early stages of recovery book (has no administration in time range)  aspirin chewable tablet 324 mg (324 mg Oral Given 09/04/22 1025)    ED Course/ Medical Decision Making/ A&P                             Medical Decision Making  Diamantina Providence McClinton 47 y.o. presented today for abnormal MRI with syncope. Working DDx that I considered at this time includes, but not limited to, CVA/TIA, arrhythmia, ACS, electrolyte abnormalities, anemia, vasovagal.  R/o DDx: TIA, arrhythmia, ACS, electrolyte abnormalities, anemia, vasovagal: These  are considered less likely due to history of present illness and physical exam findings  Review of prior external notes: 09/07/2021 ED provider  Unique Tests and My Interpretation:  CBG: 91 CBC: Unremarkable Troponin: pending [id number] Point-of-care urine pregnancy:pending [id number]  Discussion with Independent Historian:  Husband  Discussion of Management of Tests:  Viviann Spare, MD Neurology ; Marijo Conception, MD Hospitalist  Risk: High: hospitalization or escalation of hospital-level care  Risk Stratification Score: none  Plan: Patient presented for abnormal with MRI syncope. On exam patient was in no acute distress and stable vitals.  Patient neuroexam is reassuring along with her physical exam.  Her MRI report from yesterday noted that she has possible small infarct in the right frontal parietal lobe.  Neurology was consulted and stated patient can be given aspirin and that they would assess and to admit to hospitalist.  Hospitalist was consulted and accepted patient for admission.  Patient stable for admission at this time.         Final Clinical Impression(s) / ED Diagnoses Final diagnoses:  Syncope, unspecified syncope type  Cerebral infarction, unspecified mechanism [facility])    Rx / DC Orders ED  Discharge Orders     None         Remi Deter 09/04/22 1103    Ernie Avena, MD 09/04/22 1150

## 2022-09-04 NOTE — Progress Notes (Signed)
BLE venous duplex has been completed.   Results can be found under chart review under CV PROC. 09/04/2022 6:48 PM Tenessa Marsee RVT, RDMS

## 2022-09-04 NOTE — Consult Note (Addendum)
Neurology Consultation  Reason for Consult: Stroke on MRI Referring Physician: Karene Fry  CC:  History is obtained from: patient and past medical records  HPI: Marissa [name] is a 47 y.o. female with a past medical history of HTN, endometriosis who initially had a syncopal episode while in [place] in February. She was subsequently referred to cardiology and then neurology. She had an MRI done yesterday and was told to come to the ED this morning due to it showing an acute infarct. MRI outpatient showed a small infarct in the right frontoparietal region. She states that her last episode was 3 weeks ago. She notes itching, rash, severe diarrhea, becomes light headed and then typically lays down.. Typically happens 3-4 times this year so far. Additionally she has been having trouble with her memory and word finding difficulties. 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 She has seen an allergist, cardiology, and neurology outpatient for these episodes. She did recently have a colonoscopy and an echocardiogram, both of which we unremarkable  ROS: Full ROS was performed and is negative except as noted in the HPI.    Past Medical History:  Diagnosis Date   Back pain    Endometrial polyp    Endometriosis    Hip pain, bilateral    Hypertension    Urticaria      Family History  Problem Relation Age of Onset   Hypertension Paternal Grandmother    Stroke Paternal Grandmother    Stroke Mother    Hypertension Mother      Social History:   reports that she has never smoked. She has never used smokeless tobacco. She reports current alcohol use of about 1.0 standard drink of alcohol per week. She reports that she does not use drugs.  Medications  Current Facility-Administered Medications:    aspirin chewable tablet 324 mg, 324 mg, Oral, Once, Schuman, James T, PA-C  Current Outpatient Medications:    buPROPion ER (WELLBUTRIN SR) 100 MG 12 hr tablet, Take  100 mg by mouth daily., Disp: , Rfl:    cetirizine (ZYRTEC) 10 MG tablet, Take 1 tablet (10 mg total) by mouth 2 (two) times daily., Disp: 60 tablet, Rfl: 5   famotidine (PEPCID) 20 MG tablet, Take 1 tablet (20 mg total) by mouth 2 (two) times daily., Disp: 60 tablet, Rfl: 5   hydrochlorothiazide (MICROZIDE) 12.5 MG capsule, Take 12.5 mg by mouth daily., Disp: , Rfl:    Multiple Vitamins-Minerals (MULTIVITAMIN WITH MINERALS) tablet, Take 1 tablet by mouth daily., Disp: , Rfl:    potassium chloride SA (KLOR-CON M) 20 MEQ tablet, Take 1 tablet (20 mEq total) by mouth 2 (two) times daily., Disp: 10 tablet, Rfl: 0   tirzepatide (MOUNJARO) 5 MG/0.5ML Pen, Inject 5 mg into the skin once a week., Disp: , Rfl:   Exam: Current vital signs: BP (!) 138/94   Pulse 81   Resp 15   SpO2 100%  Vital signs in last 24 hours: Pulse Rate:  [76-81] 81 (06/11 0930) Resp:  [14-15] 15 (06/11 0930) BP: (138-151)/(94) 138/94 (06/11 0930) SpO2:  [100 %] 100 % (06/11 0930)  GENERAL: Awake, alert in NAD HEENT: - Normocephalic and atraumatic, dry mm, no LN++, no Thyromegally LUNGS - Clear to auscultation bilaterally with no wheezes CV - S1S2 RRR, no m/r/g, equal pulses bilaterally. ABDOMEN - Soft, nontender, nondistended with normoactive BS Ext: warm, well perfused, intact peripheral pulses, no edema  NEURO:  Mental Status: AA&Ox3  Language:  speech is clear.  Naming, repetition, fluency, and comprehension intact. Cranial Nerves: PERRL. EOMI, visual fields full, no facial asymmetry, facial sensation intact, hearing intact, tongue/uvula/soft palate midline, normal sternocleidomastoid and trapezius muscle strength. No evidence of tongue atrophy or fasciculations Motor: Strength is equal in all extremities  Tone: is normal and bulk is normal Sensation- Intact to light touch bilaterally Coordination: FTN intact bilaterally, no ataxia in BLE. Gait- deferred   Labs I have reviewed labs in epic and the results  pertinent to this consultation are:  CBC    Component Value Date/Time   WBC 7.8 09/04/2022 0933   RBC 4.98 09/04/2022 0933   HGB 14.5 09/04/2022 0933   HGB 13.3 11/01/2021 1552   HCT 43.8 09/04/2022 0933   HCT 40.8 11/01/2021 1552   PLT 286 09/04/2022 0933   MCV 88.0 09/04/2022 0933   MCV 91 11/01/2021 1552   MCH 29.1 09/04/2022 0933   MCHC 33.1 09/04/2022 0933   RDW 14.0 09/04/2022 0933   RDW 13.7 11/01/2021 1552   LYMPHSABS 2.2 09/04/2022 0933   LYMPHSABS 2.5 11/01/2021 1552   MONOABS 0.4 09/04/2022 0933   EOSABS 0.3 09/04/2022 0933   EOSABS 0.2 11/01/2021 1552   BASOSABS 0.1 09/04/2022 0933   BASOSABS 0.1 11/01/2021 1552    CMP     Component Value Date/Time   NA 143 11/01/2021 1552   K 3.6 11/01/2021 1552   CL 103 11/01/2021 1552   CO2 25 11/01/2021 1552   GLUCOSE 60 (L) 11/01/2021 1552   GLUCOSE 109 (H) 09/07/2021 0032   BUN 10 11/01/2021 1552   CREATININE 0.96 11/01/2021 1552   CREATININE 0.74 05/01/2013 1329   CALCIUM 9.5 11/01/2021 1552   PROT 7.2 11/01/2021 1552   ALBUMIN 4.2 11/01/2021 1552   AST 12 11/01/2021 1552   ALT 9 11/01/2021 1552   ALKPHOS 98 11/01/2021 1552   BILITOT 0.2 11/01/2021 1552   GFRNONAA >60 09/07/2021 0032   GFRAA >60 07/26/2019 0148    Lipid Panel     Component Value Date/Time   CHOL 179 01/29/2018 0930   TRIG 70 01/29/2018 0930   HDL 44 01/29/2018 0930   LDLCALC 121 (H) 01/29/2018 0930   LDLDIRECT 114 (H) 05/01/2013 1329     Imaging I have reviewed the images obtained:  MRI examination of the brain [facility] outpatient - There is a small focus of diffusion restriction in right frontoparietal region which is concerning for a very small recent infarct. No other significant finding.   Repeat MRI Brain - Two subcentimeter foci of signal abnormality within the right frontal and right parietal lobe white matter, as described. While these could reflect acute/early subacute infarcts, the appearance is somewhat atypical and  alternative considerations include an infectious/inflammatory process (including neurocysticercosis) and demyelinating disease. Post-contrast MR imaging of the brain is recommended for further characterization.  MRA Brain No intracranial large vessel occlusion or proximal high-grade arterial stenosis identified.  Carotid US  1.  No hemodynamically significant stenosis on either side.   2.  Both vertebral arteries are patent with antegrade flow.   Assessment:  47 y.o. female who is in the process of being worked up for syncope. She had an MRI done yesterday and was told to come to the ED this morning due to it showing an acute infarct. MRI outpatient showed a small infarct in the right frontoparietal region. She states that her last episode was 3 weeks ago. She notes itching, rash, severe diarrhea, becomes light headed and then  typically lays down.. Typically happens 3-4 times this year so far. Additionally she has been having trouble with her memory and word finding difficulties. 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 She has seen an allergist, cardiology, and neurology outpatient for these episodes. 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 MD to update note as needed.   [name], DNP, FNP-BC [facility] Pager: [phone]  ATTENDING ATTESTATION:  47 year old with no significant past medical history she has been traveling recently to many countries including [place], [place] and recently [place].  She has had some vague symptoms of memory loss. she had a  syncopal episode while in [place] in February.  No history of rheumatological or hypercoagulable illness in the family.  She had an MRI done yesterday showed an acute infarct and told come to the ER for workup.  Repeat MRI here shows right frontal and right parietal lobe white matter lesion concern for possible subacute infarct but does appear to be atypical and concerning for possible inflammatory or infectious process including neurocysticercosis.  Her exam is benign with and a stroke scale 0.  Will plan to get a brain MRI with contrast with full stroke workup including hypercoagulable workup, lower extremity Dopplers due to extensive travel and concern for DVT.  Dr. Viviann Spare evaluated pt independently, reviewed imaging, chart, labs. Discussed and formulated plan with the Resident/APP. Changes were made to the note where appropriate. Please see APP/resident note above for details. Emmerson Taddei,MD

## 2022-09-04 NOTE — ED Notes (Signed)
ED TO INPATIENT HANDOFF REPORT  ED Nurse Name and Phone #: Josh  S Name/Age/Gender Marissa [name] 47 y.o. female Room/Bed: 016C/016C  Code Status   Code Status: Full Code  Home/SNF/Other Home Patient oriented to: self, place, time, and situation Is this baseline? Yes   Triage Complete: Triage complete  Chief Complaint Acute cerebrovascular accident (CVA) [facility]) [I63.9]  Triage Note Pt. Stated, I had syncope episodes in February while in [place] when I was in the shower. I went to cardiologist and did an echo and it was normal . Was sent to a neurologist 2 weeks ago. and had a MRI and told me I could have had a stroke old or new didn't know. My concern is I have once in awhile memory. Its called the memory dysphagia.  VAN - negative.   Allergies Allergies  Allergen Reactions   Penicillins Hives    Level of Care/Admitting Diagnosis ED Disposition     ED Disposition  Admit   Condition  --   Comment  Hospital Area: [facility] [[id number]]  Level of Care: Med-Surg [16]  May place patient in observation at [facility] or [facility] if equivalent level of care is available:: No  Covid Evaluation: Asymptomatic - no recent exposure (last 10 days) testing not required  Diagnosis: Acute cerebrovascular accident (CVA) [facility]) [[id number]]  Admitting Physician: Earl Lagos [phone]  Attending Physician: Earl Lagos [phone]          B Medical/Surgery History Past Medical History:  Diagnosis Date   Back pain    Endometrial polyp    Endometriosis    Hip pain, bilateral    Hypertension    Urticaria    Past Surgical History:  Procedure Laterality Date   CESAREAN SECTION  1995   CESAREAN SECTION N/A 09/26/2016   Procedure: CESAREAN SECTION;  Surgeon: Maxie Better, MD;  [facility]: [facility];  Service: Obstetrics;  Laterality: N/A;   HYSTEROSCOPY N/A 04/14/2014   Procedure: HYSTEROSCOPY, SUCTION D&C, RESECTION OF  MYOMA;  Surgeon: Fermin Schwab, MD;  Location: [facility];  Service: Gynecology;  Laterality: N/A;   LAPAROSCOPY N/A 04/14/2014   Procedure: LAPAROSCOPY, EXCISION AND ABLATION OF ENDOMETRIOSIS, CHROMOTUBATION;  Surgeon: Fermin Schwab, MD;  Location: [facility];  Service: Gynecology;  Laterality: N/A;   TONSILLECTOMY  12-21-2003   12-27-2003--  CAUTERIZATION RIGHT TONSILLAR BLEED POST-OP     A IV Location/Drains/Wounds Patient Lines/Drains/Airways Status     Active Line/Drains/Airways     Name Placement date Placement time Site Days   Peripheral IV 09/04/22 20 G Right Antecubital 09/04/22  0941  Antecubital  less than 1   Incision (Closed) 04/14/14 Perineum Other (Comment) 04/14/14  1548  -- 3065   Incision (Closed) 04/14/14 Abdomen Other (Comment) 04/14/14  1558  -- 3065   Incision (Closed) 09/26/16 Abdomen Other (Comment) 09/26/16  0618  -- 2169   Incision (Closed) 09/26/16 Vagina 09/26/16  0621  -- 2169   Incision - 3 Ports Abdomen 1: Umbilicus 2: Right 3: Left 04/14/14  1548  -- 3065            Intake/Output Last 24 hours No intake or output data in the 24 hours ending 09/04/22 1217  Labs/Imaging Results for orders placed or performed during the hospital encounter of 09/04/22 (from the past 48 hour(s))  CBC with Differential     Status: None   Collection Time: 09/04/22  9:33 AM  Result Value Ref Range  WBC 7.8 4.0 - 10.5 K/uL   RBC 4.98 3.87 - 5.11 MIL/uL   Hemoglobin 14.5 12.0 - 15.0 g/dL   HCT 16.1 09.6 - 04.5 %   MCV 88.0 80.0 - 100.0 fL   MCH 29.1 26.0 - 34.0 pg   MCHC 33.1 30.0 - 36.0 g/dL   RDW 40.9 81.1 - 91.4 %   Platelets 286 150 - 400 K/uL   nRBC 0.0 0.0 - 0.2 %   Neutrophils Relative % 61 %   Neutro Abs 4.8 1.7 - 7.7 K/uL   Lymphocytes Relative 28 %   Lymphs Abs 2.2 0.7 - 4.0 K/uL   Monocytes Relative 6 %   Monocytes Absolute 0.4 0.1 - 1.0 K/uL   Eosinophils Relative 4 %   Eosinophils Absolute 0.3 0.0 - 0.5 K/uL    Basophils Relative 1 %   Basophils Absolute 0.1 0.0 - 0.1 K/uL   Immature Granulocytes 0 %   Abs Immature Granulocytes 0.02 0.00 - 0.07 K/uL    Comment: Performed at [facility], [address] [place], [place] [address]  Comprehensive metabolic panel     Status: Abnormal   Collection Time: 09/04/22  9:33 AM  Result Value Ref Range   Sodium 137 135 - 145 mmol/L   Potassium 3.3 (L) 3.5 - 5.1 mmol/L   Chloride 104 98 - 111 mmol/L   CO2 24 22 - 32 mmol/L   Glucose, Bld 88 70 - 99 mg/dL    Comment: Glucose reference range applies only to samples taken after fasting for at least 8 hours.   BUN 8 6 - 20 mg/dL   Creatinine, Ser 6.21 0.44 - 1.00 mg/dL   Calcium 9.3 8.9 - 30.8 mg/dL   Total Protein 8.0 6.5 - 8.1 g/dL   Albumin 4.3 3.5 - 5.0 g/dL   AST 17 15 - 41 U/L   ALT 15 0 - 44 U/L   Alkaline Phosphatase 89 38 - 126 U/L   Total Bilirubin 0.6 0.3 - 1.2 mg/dL   GFR, Estimated >65 >78 mL/min    Comment: (NOTE) Calculated using the CKD-EPI Creatinine Equation (2021)    Anion gap 9 5 - 15    Comment: Performed at [facility], [address], [place], [place] [address]  Troponin I (High Sensitivity)     Status: None   Collection Time: 09/04/22  9:33 AM  Result Value Ref Range   Troponin I (High Sensitivity) 5 <18 ng/L    Comment: (NOTE) Elevated high sensitivity troponin I (hsTnI) values and significant  changes across serial measurements may suggest ACS but many other  chronic and acute conditions are known to elevate hsTnI results.  Refer to the "Links" section for chest pain algorithms and additional  guidance. Performed at [facility], [address] [place], [place] [address]   Hemoglobin A1c     Status: Abnormal   Collection Time: 09/04/22  9:33 AM  Result Value Ref Range   Hgb A1c MFr Bld 4.7 (L) 4.8 - 5.6 %    Comment: (NOTE) Pre diabetes:          5.7%-6.4%  Diabetes:              >6.4%  Glycemic control for   <7.0% adults with diabetes     Mean Plasma Glucose 88.19 mg/dL    Comment: Performed at [facility], [address], [place], [place] [address]  CBG monitoring, ED  Status: None   Collection Time: 09/04/22  9:37 AM  Result Value Ref Range   Glucose-Capillary 91 70 - 99 mg/dL    Comment: Glucose reference range applies only to samples taken after fasting for at least 8 hours.  Urinalysis, Routine w reflex microscopic -Urine, Clean Catch     Status: Abnormal   Collection Time: 09/04/22 10:15 AM  Result Value Ref Range   Color, Urine STRAW (A) YELLOW   APPearance CLEAR CLEAR   Specific Gravity, Urine 1.004 (L) 1.005 - 1.030   pH 6.0 5.0 - 8.0   Glucose, UA NEGATIVE NEGATIVE mg/dL   Hgb urine dipstick NEGATIVE NEGATIVE   Bilirubin Urine NEGATIVE NEGATIVE   Ketones, ur NEGATIVE NEGATIVE mg/dL   Protein, ur NEGATIVE NEGATIVE mg/dL   Nitrite NEGATIVE NEGATIVE   Leukocytes,Ua NEGATIVE NEGATIVE    Comment: Performed at [facility], [address] [place], [place] [address]  POC urine preg, ED     Status: None   Collection Time: 09/04/22 10:24 AM  Result Value Ref Range   Preg Test, Ur NEGATIVE NEGATIVE    Comment:        THE SENSITIVITY OF THIS METHODOLOGY IS >24 mIU/mL    No results found.  Pending Labs [employer] (From admission, onward)     Start     Ordered   09/05/22 0500  Lipid panel  (Labs)  Tomorrow morning,   R       Comments: Fasting    09/04/22 1033   09/04/22 1122  RPR  (Dementia Panel (PNL))  Once,   URGENT        09/04/22 1121   09/04/22 1122  Vitamin B12  (Dementia Panel (PNL))  Once,   URGENT        09/04/22 1121   09/04/22 1122  Sedimentation rate  (Dementia Panel (PNL))  Once,   URGENT        09/04/22 1121   09/04/22 1122  TSH  (Dementia Panel (PNL))  Once,   URGENT        09/04/22 1121   09/04/22 1122  Folate  (Dementia Panel (PNL))  Once,   URGENT        09/04/22 1121   09/04/22 1122  HIV Antibody (routine testing w rflx)  (HIV Antibody (Routine testing w reflex)  panel)  Once,   URGENT        09/04/22 1121            Vitals/Pain Today's Vitals   09/04/22 0915 09/04/22 0930 09/04/22 1017  BP: (!) 151/94 (!) 138/94   Pulse: 76 81   Resp: 14 15   Temp:   98.2 F (36.8 C)  TempSrc:   Oral  SpO2: 100% 100%     Isolation Precautions No active isolations  Medications Medications   stroke: early stages of recovery book (has no administration in time range)  acetaminophen (TYLENOL) tablet 650 mg (has no administration in time range)    Or  acetaminophen (TYLENOL) 160 MG/5ML solution 650 mg (has no administration in time range)    Or  acetaminophen (TYLENOL) suppository 650 mg (has no administration in time range)  senna-docusate (Senokot-S) tablet 1 tablet (has no administration in time range)  enoxaparin (LOVENOX) injection 40 mg (has no administration in time range)  aspirin chewable tablet 324 mg (324 mg Oral Given 09/04/22 1025)    Mobility walks     Focused Assessments Neuro Assessment Handoff:  Swallow screen pass? Yes  NIH Stroke Scale  Dizziness Present: No Headache Present: No Interval: Initial Level of Consciousness (1a.)   : Alert, keenly responsive LOC Questions (1b. )   : Answers both questions correctly LOC Commands (1c. )   : Performs both tasks correctly Best Gaze (2. )  : Normal Visual (3. )  : No visual loss Facial Palsy (4. )    : Normal symmetrical movements Motor Arm, Left (5a. )   : No drift Motor Arm, Right (5b. ) : No drift Motor Leg, Left (6a. )  : No drift Motor Leg, Right (6b. ) : No drift Limb Ataxia (7. ): Absent Sensory (8. )  : Normal, no sensory loss Best Language (9. )  : No aphasia Dysarthria (10. ): Normal Extinction/Inattention (11.)   : No Abnormality Complete NIHSS TOTAL: 0     Neuro Assessment:   Neuro Checks:   Initial (09/04/22 1025)  Has TPA been given? No If patient is a Neuro Trauma and patient is going to OR before floor call report to 4N Charge nurse: [phone] or  [phone]   R Recommendations: See Admitting Provider Note  Report given to:   Additional Notes:

## 2022-09-04 NOTE — ED Notes (Signed)
Got patient into a gown on the monitor  did EKG shown to Dr Karene Fry patient is resting with call bell in reach got patient a warm blanket

## 2022-09-04 NOTE — Evaluation (Signed)
Occupational Therapy Evaluation Patient Details Name: Marissa [name] MRN: [id number] DOB: [birth year] Today's Date: 09/04/2022   History of Present Illness Marissa [name] is 47yo person with hypertension who is presenting to [facility] after having abnormal MRI finding with her neurologist yesterday.  Repeat MRI here describes two possible early/subacute infarcts within the right frontal and parietal lobe white matter,  PMH of HTN, back pain, endometriosis.   Clinical Impression   Pt currently independent for selfcare tasks and functional mobility in the room and the hallway.  She was able to negotiate a flight of stairs without use of the handrail.  She was able to recall 2/3 words after 2-3 minute delay with 3/3 after 5-7 min delay.  Feel she is at functional baseline for selfcare tasks and warrants no further acute or post acute OT needs at this time.        Recommendations for follow up therapy are one component of a multi-disciplinary discharge planning process, led by the attending physician.  Recommendations may be updated based on patient status, additional functional criteria and insurance authorization.   Assistance Recommended at Discharge None     Functional Status Assessment  Patient has not had a recent decline in their functional status  Equipment Recommendations  None recommended by OT       Precautions / Restrictions Precautions Precautions: None      Mobility Bed Mobility Overal bed mobility: Independent                  Transfers Overall transfer level: Independent                        Balance Overall balance assessment: Independent                                         ADL either performed or assessed with clinical judgement   ADL Overall ADL's : Independent;At baseline                                       General ADL Comments: Pt with no limitations in balance or ADL function at  this time.     Vision Baseline Vision/History: 1 Wears glasses (reading glasses) Ability to See in Adequate Light: 0 Adequate Patient Visual Report: No change from baseline Vision Assessment?: No apparent visual deficits     Perception  WFL   Praxis  [facility]    Pertinent Vitals/Pain Pain Assessment Pain Assessment: No/denies pain     Hand Dominance Right   Extremity/Trunk Assessment Upper Extremity Assessment Upper Extremity Assessment: Overall WFL for tasks assessed   Lower Extremity Assessment Lower Extremity Assessment: Overall WFL for tasks assessed   Cervical / Trunk Assessment Cervical / Trunk Assessment: Normal   Communication Communication Communication: No difficulties   Cognition Arousal/Alertness: Awake/alert Behavior During Therapy: WFL for tasks assessed/performed Overall Cognitive Status: Within Functional Limits for tasks assessed                                 General Comments: Pt initially 2/3 word recall, she then recalled all 3 after 5-7 minute delay.                Home  Living Family/patient expects to be discharged to:: Private residence Living Arrangements: Spouse/significant other Available Help at Discharge: Available 24 hours/day Type of Home: House Home Access: Stairs to enter [employer] of Steps: 5 Entrance Stairs-Rails: Right Home Layout: Two level;Bed/bath upstairs Alternate Level Stairs-Number of Steps: 16 Alternate Level Stairs-Rails: Left;Right Bathroom Shower/Tub: Walk-in shower;Door   [employer] Toilet: Handicapped height     Home Equipment: None   Additional Comments: Publishing rights manager      Prior Functioning/Environment Prior Level of Function : Independent/Modified Independent                         AM-PAC OT "6 Clicks" Daily Activity     Outcome Measure Help from another person eating meals?: None Help from another person taking care of personal grooming?: None Help from  another person toileting, which includes using toliet, bedpan, or urinal?: None Help from another person bathing (including washing, rinsing, drying)?: None Help from another person to put on and taking off regular upper body clothing?: None Help from another person to put on and taking off regular lower body clothing?: None 6 Click Score: 24   End of Session Equipment Utilized During Treatment: Gait belt Nurse Communication: Mobility status  Activity Tolerance: Patient tolerated treatment well Patient left: in chair;with call bell/phone within reach                   Time: 1513-1525 OT Time Calculation (min): 12 min Charges:  OT General Charges $OT Visit: 1 Visit OT Evaluation $OT Eval Low Complexity: 1 Low Perrin Maltese, OTR/L Acute Rehabilitation Services  Office [phone] 09/04/2022

## 2022-09-04 NOTE — H&P (Signed)
NAME:  Marissa [name], MRN:  [id number], DOB:  Sep 19, 1975, LOS: 0 ADMISSION DATE:  09/04/2022, Primary: [name], MD  CHIEF COMPLAINT:  syncope   Medical Service: Internal Medicine Teaching Service         Attending Physician: Dr. Earl Lagos, MD    First Contact: Dr. Sherrilee Gilles Pager: [phone]  Second Contact: Dr. Ned Card Pager: [phone]       After Hours (After 5p/  First Contact Pager: [phone]  weekends / holidays): Second Contact Pager: [phone]   HISTORY OF PRESENT ILLNESS   Marissa [name] is 47yo person with hypertension who is presenting to [facility] after having abnormal MRI finding with her neurologist yesterday. Patient had been having re-occurring syncopal episodes and was referred to a cardiologist and neurologist for these. She describes these episodes as sudden-onset, starting with itching at the top of her head and progressing distally and associated with hives. She typically then gets hot flashes, palpitations, and gets dizziness. Sometimes these are associated with large bowel movements, which relieve her symptoms. She first had an episode in [place] a few months ago which was associated with loss of consciousness. She has had a few of these episodes since then, but she typically will sit down and has not had any other instances where she lost consciousness. Otherwise she has felt at baseline recently, denying any vision changes, focal weakness/numbness, difficulties ambulating. Of note, she does mention some mild memory issues that appear chronic. She describes sometimes not remembering certain dates/plans with some intermittent word-finding difficulty.   PCP: [name], MD  ED COURSE   Patient arrived afebrile, hemodynamically stable on room air. Initial   PAST MEDICAL HISTORY   She,  has a past medical history of Back pain, Endometrial polyp, Endometriosis, Hip pain, bilateral, Hypertension, and Urticaria.   HOME MEDICATIONS   Prior to  Admission medications   Medication Sig Start Date End Date Taking? Authorizing Provider  B Complex Vitamins (B COMPLEX PO) Take 1 tablet by mouth daily.   Yes [provider]  buPROPion ER (WELLBUTRIN SR) 100 MG 12 hr tablet Take 100 mg by mouth daily. 09/18/21  Yes [provider]  cetirizine (ZYRTEC) 10 MG tablet Take 1 tablet (10 mg total) by mouth 2 (two) times daily. Patient taking differently: Take 10 mg by mouth 2 (two) times daily as needed for allergies. 11/01/21  Yes Padgett, Pilar Grammes, MD  Cholecalciferol (VITAMIN D-3 PO) Take 1 tablet by mouth daily.   Yes [provider]  estrogens, conjugated, (PREMARIN) 0.3 MG tablet Take 0.3 mg by mouth See admin instructions. 0.3 mg every Tuesday, Thursday.   Yes [provider]  famotidine (PEPCID) 20 MG tablet Take 1 tablet (20 mg total) by mouth 2 (two) times daily. Patient taking differently: Take 20 mg by mouth 2 (two) times daily as needed (allergies when taking Zyrtec). 11/01/21  Yes Padgett, Pilar Grammes, MD  losartan (COZAAR) 25 MG tablet Take 25 mg by mouth daily.   Yes [provider]  montelukast (SINGULAIR) 10 MG tablet Take 10 mg by mouth daily as needed (allergies).   Yes [provider]  Prasterone, DHEA, (DHEA PO) Take 1 capsule by mouth daily.   Yes [provider]  tirzepatide Greggory Keen) 5 MG/0.5ML Pen Inject 5 mg into the skin every Monday. 08/07/21  Yes [provider]  potassium chloride SA (KLOR-CON M) 20 MEQ tablet Take 1 tablet (20 mEq total) by mouth 2 (two) times daily.  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 She denies smoking or drug use, reports occasional alcohol use. Completes ADL's and IADL's without  difficulty.   FAMILY HISTORY   Her family history includes Hypertension in her mother and paternal grandmother; Stroke in her mother and paternal grandmother.   REVIEW OF SYSTEMS   ROS per history of present illness.  PHYSICAL EXAMINATION   Blood pressure 137/89, pulse 88, temperature 98.1 F (36.7 C), temperature source Oral, resp. rate 18, height 5\' 1"  (1.549 m), SpO2 100 %, unknown if currently breastfeeding.    There were no vitals filed for this visit.  GENERAL: Well-appearing person resting in bed in no acute distress HENT: Normocephalic, atraumatic. Moist mucous membranes.  EYES: Vision grossly in tact. No scleral icterus or conjunctival injection. CV: Regular rate, rhythm. No murmurs appreciated. Distal pulses 2+ bilaterally.  PULM: Normal work of breathing on room air. Clear to auscultation bilaterally.  GI: Abdomen soft, non-tender, non-distended. Normoactive bowel sounds.  MSK: Normal bulk, tone. No peripheral edema noted.  SKIN: Warm, dry. No rashes or lesions noted.  NEURO: Awake, alert, conversing appropriately. Cranial nerves in tact. Strength 5/5 throughout. Sensation in tact throughout. Finger to nose normal bilaterally.  PSYCH: Normal mood, affect, speech.   SIGNIFICANT DIAGNOSTIC TESTS   ECG: Normal sinus rhythm. Left axis deviation present. No significant ST changes.   I personally reviewed patient's ECG with my interpretation as above.  LABS      Latest Ref Rng & Units 09/04/2022    9:33 AM 11/01/2021    3:52 PM 09/08/2021   12:32 AM  CBC  WBC 4.0 - 10.5 K/uL 7.8  9.1  14.5   Hemoglobin 12.0 - 15.0 g/dL 69.6  29.5  28.4   Hematocrit 36.0 - 46.0 % 43.8  40.8  39.6   Platelets 150 - 400 K/uL 286   329       Latest Ref Rng & Units 09/04/2022    9:33 AM 11/01/2021    3:52 PM 09/07/2021   12:32 AM  BMP  Glucose 70 - 99 mg/dL 88  60  132   BUN 6 - 20 mg/dL 8  10  7    Creatinine 0.44 - 1.00 mg/dL 4.40  1.02  7.25   BUN/Creat Ratio 9 - 23  10    Sodium  135 - 145 mmol/L 137  143  140   Potassium 3.5 - 5.1 mmol/L 3.3  3.6  2.8   Chloride 98 - 111 mmol/L 104  103  102   CO2 22 - 32 mmol/L 24  25  29    Calcium 8.9 - 10.3 mg/dL 9.3  9.5  9.3    CONSULTS   neurology  ASSESSMENT   Tamalyn Melka is 46yo person with hypertension admitted 6/11 with likely acute/early subacute cerebral infarction.   PLAN   Principal Problem:   Acute cerebrovascular accident (CVA) (HCC)  #Possible acute cerebral infarction #Word-finding difficulties  Repeat MRI here describes two possible early/subacute infarcts within the right frontal and parietal lobe white matter, no large vessel occlusion on MRA. On exam she is non-focal, and based on her history she never had any acute symptoms of a stroke. MRI read also mentioned potential inflammatory processes/demyelinating disease.  Given she is not having any other symptoms I am not sure any spinal imaging would be beneficial right now. We will obtain Echo as part of stroke work-up and discuss with neurology best next steps to work this up.  - Dual anti-platelet therapy with aspirin and Plavix - Follow-up Echocardiogram - Lipid panel, A1c - TSH, ESR, B12, RPR - PT/OT - Telemetry  #Syncopal episodes Sound most consistent with vasovagal episodes. She cannot identify any specific trigger, including place/time/food. Could potentially be related to abnormal MRI findings? Will place on telemetry, getting Echo, orthostatics. - Follow-up orthostatics - Echocardiogram - Telemetry   #Hypertension Normotensive. Not sure if she would benefit from permissive hypertension at this point, but will hold pending neurology's recommendations.  - Hold home medications   BEST PRACTICE   DIET: Regular  IVF: na DVT PPX: lovenox BOWEL: na CODE: FULL FAM COM: family at bedside on admission  DISPO: Admit patient to Observation with expected length of stay less than 2 midnights.  Evlyn Kanner, MD Internal  Medicine Resident PGY-3 Pager [phone] 09/04/22 2:33 PM

## 2022-09-04 NOTE — Plan of Care (Signed)

## 2022-09-04 NOTE — Progress Notes (Signed)
PT Cancellation Note  Patient Details Name: Marissa [name] MRN: [id number] DOB: March 23, 1976   Cancelled Treatment:    Reason Eval/Treat Not Completed: PT screened, no needs identified, will sign off (OT evaluated; pt independent)  Lillia Pauls, PT, DPT Acute Rehabilitation Services Office [phone]    Norval Morton 09/04/2022, 3:35 PM

## 2022-09-05 ENCOUNTER — Observation Stay (HOSPITAL_COMMUNITY): Payer: 59

## 2022-09-05 ENCOUNTER — Observation Stay (HOSPITAL_BASED_OUTPATIENT_CLINIC_OR_DEPARTMENT_OTHER): Payer: 59

## 2022-09-05 ENCOUNTER — Other Ambulatory Visit (HOSPITAL_COMMUNITY): Payer: Self-pay

## 2022-09-05 DIAGNOSIS — E785 Hyperlipidemia, unspecified: Secondary | ICD-10-CM

## 2022-09-05 DIAGNOSIS — R55 Syncope and collapse: Secondary | ICD-10-CM

## 2022-09-05 DIAGNOSIS — R569 Unspecified convulsions: Secondary | ICD-10-CM

## 2022-09-05 DIAGNOSIS — I10 Essential (primary) hypertension: Secondary | ICD-10-CM | POA: Diagnosis not present

## 2022-09-05 DIAGNOSIS — I63521 Cerebral infarction due to unspecified occlusion or stenosis of right anterior cerebral artery: Secondary | ICD-10-CM

## 2022-09-05 DIAGNOSIS — I639 Cerebral infarction, unspecified: Secondary | ICD-10-CM | POA: Diagnosis not present

## 2022-09-05 LAB — BASIC METABOLIC PANEL
Anion gap: 9 (ref 5–15)
BUN: 12 mg/dL (ref 6–20)
CO2: 24 mmol/L (ref 22–32)
Calcium: 9 mg/dL (ref 8.9–10.3)
Chloride: 105 mmol/L (ref 98–111)
Creatinine, Ser: 0.94 mg/dL (ref 0.44–1.00)
GFR, Estimated: >60 mL/min (ref >60)
Glucose, Bld: 108 mg/dL — ABNORMAL HIGH (ref 70–99)
Potassium: 3.7 mmol/L (ref 3.5–5.1)
Sodium: 138 mmol/L (ref 135–145)

## 2022-09-05 LAB — LUPUS ANTICOAGULANT PANEL
DRVVT: 34.9 s (ref 0.0–47.0)
PTT Lupus Anticoagulant: 31.3 s (ref 0.0–43.5)

## 2022-09-05 LAB — RAPID URINE DRUG SCREEN, HOSP PERFORMED
Amphetamines: NOT DETECTED
Barbiturates: NOT DETECTED
Benzodiazepines: NOT DETECTED
Cocaine: NOT DETECTED
Opiates: NOT DETECTED
Tetrahydrocannabinol: NOT DETECTED

## 2022-09-05 LAB — MAGNESIUM: Magnesium: 1.8 mg/dL (ref 1.7–2.4)

## 2022-09-05 LAB — PROTEIN S ACTIVITY: Protein S Activity: 84 % (ref 63–140)

## 2022-09-05 LAB — LIPID PANEL
Cholesterol: 172 mg/dL (ref 0–200)
HDL: 53 mg/dL (ref >40)
LDL Cholesterol: 108 mg/dL — ABNORMAL HIGH (ref 0–99)
Total CHOL/HDL Ratio: 3.2 RATIO
Triglycerides: 55 mg/dL (ref <150)
VLDL: 11 mg/dL (ref 0–40)

## 2022-09-05 LAB — PROTEIN S, TOTAL: Protein S Ag, Total: 97 % (ref 60–150)

## 2022-09-05 LAB — PROTEIN C ACTIVITY: Protein C Activity: 141 % (ref 73–180)

## 2022-09-05 LAB — RPR: RPR Ser Ql: NONREACTIVE

## 2022-09-05 LAB — HOMOCYSTEINE: Homocysteine: 7.2 umol/L (ref 0.0–14.5)

## 2022-09-05 MED ORDER — CLOPIDOGREL BISULFATE 75 MG PO TABS
75.0000 mg | ORAL_TABLET | Freq: Every day | ORAL | 0 refills | Status: DC
  Filled 2022-09-05: qty 21, 21d supply, fill #0

## 2022-09-05 MED ORDER — ROSUVASTATIN CALCIUM 20 MG PO TABS
20.0000 mg | ORAL_TABLET | Freq: Every day | ORAL | 0 refills | Status: DC
  Filled 2022-09-05: qty 30, 30d supply, fill #0

## 2022-09-05 MED ORDER — IOHEXOL 350 MG/ML SOLN
75.0000 mL | Freq: Once | INTRAVENOUS | Status: AC | PRN
  Administered 2022-09-05: 75 mL via INTRAVENOUS

## 2022-09-05 MED ORDER — ASPIRIN 81 MG PO CHEW
81.0000 mg | CHEWABLE_TABLET | Freq: Every day | ORAL | 0 refills | Status: DC
  Filled 2022-09-05: qty 90, 90d supply, fill #0

## 2022-09-05 MED ORDER — EPINEPHRINE 0.3 MG/0.3ML IJ SOAJ
0.3000 mg | INTRAMUSCULAR | 0 refills | Status: AC | PRN
  Filled 2022-09-05: qty 2, 30d supply, fill #0

## 2022-09-05 MED ORDER — ROSUVASTATIN CALCIUM 20 MG PO TABS
20.0000 mg | ORAL_TABLET | Freq: Every day | ORAL | Status: DC
  Administered 2022-09-05: 20 mg via ORAL
  Filled 2022-09-05: qty 1

## 2022-09-05 NOTE — Evaluation (Addendum)
Speech Language Pathology Evaluation Patient Details Name: Marissa [name] MRN: [id number] DOB: May 24, 1975 Today's Date: 09/05/2022 Time: 1000-1024 SLP Time Calculation (min) (ACUTE ONLY): 24 min  Problem List:  Patient Active Problem List   Diagnosis Date Noted   Acute cerebrovascular accident (CVA) (HCC) 09/04/2022   Insulin resistance 02/17/2018   Class 2 severe obesity with serious comorbidity and body mass index (BMI) of 37.0 to 37.9 in adult [facility]) 02/17/2018   Postpartum care following cesarean delivery 7/4 09/26/2016   Status post repeat low transverse cesarean section / failed TOLAC, arrest of dilation, presumed chorioamnionitis, PIH 09/26/2016   Ovarian cyst, right 05/19/2013   Essential hypertension 06/25/2012   Past Medical History:  Past Medical History:  Diagnosis Date   Back pain    Endometrial polyp    Endometriosis    Hip pain, bilateral    Hypertension    Urticaria    Past Surgical History:  Past Surgical History:  Procedure Laterality Date   CESAREAN SECTION  1995   CESAREAN SECTION N/A 09/26/2016   Procedure: CESAREAN SECTION;  Surgeon: Maxie Better, MD;  Location: [facility];  Service: Obstetrics;  Laterality: N/A;   HYSTEROSCOPY N/A 04/14/2014   Procedure: HYSTEROSCOPY, SUCTION D&C, RESECTION OF MYOMA;  Surgeon: Fermin Schwab, MD;  Location: [facility];  Service: Gynecology;  Laterality: N/A;   LAPAROSCOPY N/A 04/14/2014   Procedure: LAPAROSCOPY, EXCISION AND ABLATION OF ENDOMETRIOSIS, CHROMOTUBATION;  Surgeon: Fermin Schwab, MD;  Location: [facility];  Service: Gynecology;  Laterality: N/A;   TONSILLECTOMY  12-21-2003   12-27-2003--  CAUTERIZATION RIGHT TONSILLAR BLEED POST-OP   HPI:  Marissa [name] is 47yo female with hypertension who is presented to [facility] on 09/04/22 after having abnormal MRI finding with her neurologist yesterday. Patient had been having re-occurring syncopal  episodes and was referred to a cardiologist and neurologist for these. She describes these episodes as sudden-onset, starting with itching at the top of her head and progressing distally and associated with hives. She typically then gets hot flashes, palpitations, and gets dizziness. Stated she is in peri-menopause which may influence memory recall intermittently. She first had an episode in [place] a few months ago which was associated with loss of consciousness. She has had a few of these episodes since then, but she typically will sit down and has not had any other instances where she lost consciousness. Pt informed MD of some mild memory issues that appear chronic. She describes sometimes not remembering certain dates/plans with some intermittent word-finding difficulty. MRI head indicated  Two subcentimeter foci of signal abnormality within the right frontal and right parietal lobe white matter, as described. While these could reflect acute/early subacute infarcts, the appearance is somewhat atypical and alternative considerations include an infectious/inflammatory process (including neurocysticercosis) and demyelinating disease. Post-contrast MR imaging of the brain is recommended for further characterization. 2. Otherwise unremarkable non-contrast MRI appearance of the brain.  ST consulted for speech/language cognitive assessment.   Assessment / Plan / Recommendation Clinical Impression  Pt assessed via the [employer] Mental Status Examination (SLUMS) with a score obtained of 27/30 which falls within the normal range, but pt reporting word finding/memory deficits at baseline.  Memory recall for objects after a time delay was 60% accurate without cues, but with category cues, pt recalled 100% of information.  Paragraph retention accurate and all attention tasks accurate with exception of digit reversal past 3 digits, where pt transposed last two digits in a 4 digit  number.  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  Pt is experiencing peri-menopause which also may have an intermittent impact on word finding.  Pt may benefit from short-term OP SLP to learn compensatory strategies to combat memory recall deficits prn if symptoms persist.  SLP suggested allocating responsibility to others to decrease stress/workload responsibilities to A with current cognitive status.  No f/u in acute setting recommended, but OP SLP prn if pt experiences ongoing cognitive decline/memory recall deficits.  Thank you for this consult.    SLP Assessment  SLP Recommendation/Assessment: All further Speech Language Pathology  needs can be addressed in the next venue of care SLP Visit Diagnosis: Cognitive communication deficit (R41.841)    Recommendations for follow up therapy are one component of a multi-disciplinary discharge planning process, led by the attending physician.  Recommendations may be updated based on patient status, additional functional criteria and insurance authorization.    Follow Up Recommendations  Outpatient SLP (prn if symptoms persist)    Assistance Recommended at Discharge  PRN  Functional Status Assessment Patient has had a recent decline in their functional status and demonstrates the ability to make significant improvements in function in a reasonable and predictable amount of time.  Frequency and Duration  (evaluation only)         SLP Evaluation Cognition  Overall Cognitive Status: Within Functional Limits for tasks assessed Arousal/Alertness: Awake/alert Orientation Level: Oriented X4 Year: 2024 Month: June Day of Week: Correct Attention: Sustained Sustained Attention: Impaired Sustained Attention Impairment: Verbal complex;Functional complex Memory: Impaired Memory Impairment: Decreased short term memory;Retrieval  deficit Decreased Short Term Memory: Verbal basic;Functional basic Awareness: Appears intact Problem Solving: Appears intact Executive Function: Organizing Organizing: Impaired Organizing Impairment: Verbal complex;Functional complex Safety/Judgment: Appears intact Comments: Pt experiencing perimenapause impacting word finding also       Comprehension  Auditory Comprehension Overall Auditory Comprehension: Appears within functional limits for tasks assessed Yes/No Questions: Within Functional Limits Commands: Within Functional Limits Conversation: Complex Interfering Components: Working Radio broadcast assistant: Repetition Counsellor: Within Function Limits Reading Comprehension Reading Status: Within funtional limits    Expression Expression Primary Mode of Expression: Verbal Verbal Expression Overall Verbal Expression: Impaired Initiation: No impairment Level of Generative/Spontaneous Verbalization: Conversation Repetition: No impairment Naming: Impairment Responsive: 76-100% accurate Confrontation: Within functional limits Convergent: 75-100% accurate Divergent: 75-100% accurate Other Naming Comments: experiencing peri-menapause, recent R parietal CVA, word finding issues reported at baseline Verbal Errors: Aware of errors Pragmatics: No impairment Interfering Components: Premorbid deficit Non-Verbal Means of Communication: Not applicable Written Expression Dominant Hand: Right Written Expression: Within Functional Limits   Oral / Motor  Oral Motor/Sensory Function Overall Oral Motor/Sensory Function: Within functional limits Motor Speech Overall Motor Speech: Appears within functional limits for tasks assessed Respiration: Within functional limits Phonation: Normal Resonance: Within functional limits Articulation: Within functional limitis Intelligibility: Intelligible Motor Planning: Witnin functional limits Motor  Speech Errors: Not applicable Interfering Components: Premorbid status            Pat Nevea Spiewak,M.S., CCC-SLP 09/05/2022, 11:02 AM

## 2022-09-05 NOTE — Discharge Summary (Signed)
Name: Marissa [name] MRN: [id number] DOB: 1975/03/27 47 y.o. PCP: [name], MD  Date of Admission: 09/04/2022  9:09 AM Date of Discharge: 09/05/2022 Attending Physician: Dr. Heide Spark  Discharge Diagnosis: Principal Problem:   Acute cerebrovascular accident (CVA) [facility]) Recurrent syncope Hyperlipidemia  Discharge Medications: Allergies as of 09/05/2022       Reactions   Penicillins Hives        Medication List     STOP taking these medications    losartan 25 MG tablet Commonly known as: COZAAR   potassium chloride SA 20 MEQ tablet Commonly known as: KLOR-CON M       TAKE these medications    aspirin 81 MG chewable tablet Chew 1 tablet (81 mg total) by mouth daily. Start taking on: September 06, 2022   B COMPLEX PO Take 1 tablet by mouth daily.   buPROPion ER 100 MG 12 hr tablet Commonly known as: WELLBUTRIN SR Take 100 mg by mouth daily.   cetirizine 10 MG tablet Commonly known as: ZYRTEC Take 1 tablet (10 mg total) by mouth 2 (two) times daily. What changed:  when to take this reasons to take this   clopidogrel 75 MG tablet Commonly known as: PLAVIX Take 1 tablet (75 mg total) by mouth daily for 21 days. Start taking on: September 06, 2022   DHEA PO Take 1 capsule by mouth daily.   EPINEPHrine 0.3 mg/0.3 mL Soaj injection Commonly known as: EpiPen 2-Pak Inject 0.3 mg into the muscle as needed for up to 1 day for anaphylaxis.   estrogens (conjugated) 0.3 MG tablet Commonly known as: PREMARIN Take 0.3 mg by mouth See admin instructions. 0.3 mg every Tuesday, Thursday.   famotidine 20 MG tablet Commonly known as: PEPCID Take 1 tablet (20 mg total) by mouth 2 (two) times daily. What changed:  when to take this reasons to take this   montelukast 10 MG tablet Commonly known as: SINGULAIR Take 10 mg by mouth daily as needed (allergies).   Mounjaro 5 MG/0.5ML Pen Generic drug: tirzepatide Inject 5 mg into the skin every Monday.    rosuvastatin 20 MG tablet Commonly known as: CRESTOR Take 1 tablet (20 mg total) by mouth daily. Start taking on: September 06, 2022   VITAMIN D-3 PO Take 1 tablet by mouth daily.        Disposition and follow-up:   [name] was discharged from [facility] in Stable condition.  At the hospital follow up visit please address:  1.  Follow-up:  a.  Acute CVA: Patient admitted for right frontal parietal region infarct.  Patient started on DAPT with aspirin/Plavix for 21 days followed by aspirin alone.  Patient referred to neurology for stroke follow-up.  No neurological deficits on discharge.  Follow-up neuroexam at follow-up.  Patient is going to follow-up with cardiology for TEE as well as loop recorder.  Follow-up hypercoagulable workup.    b.  Recurrent Syncope: Patient evaluated with echo which did not show any valvular abnormalities.  Telemetry did not show any arrhythmias.  Patient will follow-up with cardiology.  This does sound somewhat like anaphylaxis, which patient discharged on EpiPen with.  Consider carcinoid workup outpatient.  Follow-up for further episodes.   c.  Hyperlipidemia: LDL during hospitalization elevated.  Started on rosuvastatin 20 mg daily.  Ensure patient has refills.   D.  Hypertension: Patient was normotensive during hospitalization.  Discontinue losartan.  Can restart outpatient if necessary.  2.  Labs / imaging needed at time of follow-up: BMP, CBC  3.  Pending labs/ test needing follow-up: Follow-up hypercoagulable workup  4.  Medication Changes  Patient started on aspirin/Plavix for 21 days followed by aspirin alone.  Patient discharged with EpiPen as well.  Started rosuvastatin 20 mg.  Discontinued losartan   Follow-up Appointments:  Follow-up Information     [facility]. Schedule an appointment as soon as possible for a visit.   Specialty: Rehabilitation Contact information: [address] [id number] [place] [address] [phone]              Dr. Jacinto Halim 09/12/1022 PCP follow-up in 1 to 2 weeks  Hospital Course by problem list: This is a 47 year old female with a past medical history of hypertension who presented to the emergency department after being sent for abnormal MRI.  Patient admitted for further evaluation and management of acute right frontal parietal region stroke.  #Acute cerebral infarction Patient presented for MRI abnormality.  Initially thought to be infectious, inflammatory, or acute infarct.  After further evaluation and imaging it was likely a stroke.  Patient was loaded with aspirin and Plavix.  Patient was seen by physical therapy and Occupational Therapy during hospitalization.  Patient progressed well.  Echo was unrevealing.  Patient did have elevated LDL and started on statin.  Patient did not have any residual neurological deficits.  Patient discharged on aspirin and Plavix for 21 days, followed by aspirin alone.  Patient also discharged on rosuvastatin 20 mg daily.  Patient to follow-up with primary care physician, and heart doctor outpatient.  Patient will also follow-up with neurology.  Patient had DVT studies as well which were negative.  Patient had hypercoagulable workup placed, and can be followed up outpatient.  #Recurrent syncope #Concern for anaphylaxis Patient has had recurrent syncopal episodes in which patient becomes diaphoretic, flushed, developed urticaria, and has an explosive bowel movement and feels like she is about to pass out.  Patient has learned to deal with this.  It has happened about 3-4 times this year.  Patient has had alpha gal workup in the past, which was negative.  There also has been concerned about potential carcinoid, which patient can be worked up outpatient for.  Orthostatics during hospitalization were within normal limits.  No further syncopal episodes during hospitalization.   Patient will be getting TEE as well as loop recorder placed on follow-up with cardiologist which has been scheduled.  PCP can potentially can workup carcinoid.  Patient discharged on EpiPen to see if this can help when episodes come on.  #Hypertension Patient remained normotensive during hospitalization.  Held home losartan on discharge.  Restart if indicated outpatient.  #Hyperlipidemia Patient found to have elevated LDL at 108 during hospitalization.  Given concern for stroke, patient started on rosuvastatin 20 mg daily.  Follow-up lipid panel outpatient.  Discharge Subjective:  Patient evaluated bedside this morning.  She denies any concerns.  She denies any trouble speaking, trouble swallowing, trouble finding words.  She denies any further syncopal episodes.  Denies any numbness or tingling.  Discharge Exam:   BP 107/71 (BP Location: Left Arm)   Pulse 79   Temp 98.6 F (37 C) (Oral)   Resp 18   Ht 5\' 1"  (1.549 m)   SpO2 100%   BMI 32.91 kg/m  Constitutional: Resting in bed, no acute distress HENT: normocephalic atraumatic Eyes: conjunctiva non-erythematous, PERRLA, EOMI Cardiovascular: regular rate and rhythm, no m/r/g Pulmonary/Chest: normal work of  breathing on room air, lungs clear to auscultation bilaterally Neurological: alert & oriented x 3, 5/5 strength in bilateral upper and lower extremities, normal gait, cranial nerves II through XII intact.  Finger-to-nose intact.  Heel-to-shin intact.  No sensation deficits. Skin: warm and dry  Pertinent Labs, Studies, and Procedures:     Latest Ref Rng & Units 09/04/2022    9:33 AM 11/01/2021    3:52 PM 09/08/2021   12:32 AM  CBC  WBC 4.0 - 10.5 K/uL 7.8  9.1  14.5   Hemoglobin 12.0 - 15.0 g/dL 16.1  09.6  04.5   Hematocrit 36.0 - 46.0 % 43.8  40.8  39.6   Platelets 150 - 400 K/uL 286   329        Latest Ref Rng & Units 09/05/2022    2:54 AM 09/04/2022    9:33 AM 11/01/2021    3:52 PM  CMP  Glucose 70 - 99 mg/dL 409  88  60    BUN 6 - 20 mg/dL 12  8  10    Creatinine 0.44 - 1.00 mg/dL 8.11  9.14  7.82   Sodium 135 - 145 mmol/L 138  137  143   Potassium 3.5 - 5.1 mmol/L 3.7  3.3  3.6   Chloride 98 - 111 mmol/L 105  104  103   CO2 22 - 32 mmol/L 24  24  25    Calcium 8.9 - 10.3 mg/dL 9.0  9.3  9.5   Total Protein 6.5 - 8.1 g/dL  8.0  7.2   Total Bilirubin 0.3 - 1.2 mg/dL  0.6  0.2   Alkaline Phos 38 - 126 U/L  89  98   AST 15 - 41 U/L  17  12   ALT 0 - 44 U/L  15  9     CT ANGIO HEAD NECK W WO CM  Result Date: 09/05/2022 CLINICAL DATA:  Stroke/TIA.  The term and embolic source. EXAM: CT ANGIOGRAPHY HEAD AND NECK WITH AND WITHOUT CONTRAST TECHNIQUE: Multidetector CT imaging of the head and neck was performed using the standard protocol during bolus administration of intravenous contrast. Multiplanar CT image reconstructions and MIPs were obtained to evaluate the vascular anatomy. Carotid stenosis measurements (when applicable) are obtained utilizing NASCET criteria, using the distal internal carotid diameter as the denominator. RADIATION DOSE REDUCTION: This exam was performed according to the departmental dose-optimization program which includes automated exposure control, adjustment of the mA and/or kV according to patient size and/or use of iterative reconstruction technique. CONTRAST:  75mL OMNIPAQUE IOHEXOL 350 MG/ML SOLN COMPARISON:  MRI of the brain September 04, 2022. FINDINGS: CT HEAD FINDINGS Brain: No evidence of acute infarction, hemorrhage, hydrocephalus, extra-axial collection or mass lesion/mass effect. Small foci of restricted diffusion on prior MRI are too small to be accurately characterized on CT. Vascular: No hyperdense vessel or unexpected calcification. Skull: Normal. Negative for fracture or focal lesion. Sinuses/Orbits: No acute finding. Other: Enlarged, partial empty sella. Review of the MIP images confirms the above findings CTA NECK FINDINGS Aortic arch: Imaged portion shows no evidence of aneurysm or  dissection. No significant stenosis of the major arch vessel origins. The innominate and left common carotid artery have a common origin from the aortic arch. Right carotid system: No evidence of dissection, stenosis (50% or greater), or occlusion. Left carotid system: No evidence of dissection, stenosis (50% or greater), or occlusion. Vertebral arteries: Codominant. No evidence of dissection, stenosis (50% or greater), or occlusion. Skeleton: Negative. Other neck:  A 6 mm right thyroid lobe nodule. No follow-up image recommended. Upper chest: Negative. Review of the MIP images confirms the above findings CTA HEAD FINDINGS Anterior circulation: No significant stenosis, proximal occlusion, aneurysm, or vascular malformation. Posterior circulation: No significant stenosis, proximal occlusion, aneurysm, or vascular malformation. Venous sinuses: As permitted by contrast timing, patent. Anatomic variants: None significant. Review of the MIP images confirms the above findings IMPRESSION: 1. No acute intracranial abnormality. 2. No intracranial large vessel occlusion or significant stenosis. 3. No hemodynamically significant stenosis in the neck. 4. Enlarged, partial empty sella, which is a nonspecific finding but can be seen in the setting of idiopathic intracranial hypertension (pseudotumor cerebri). 5. Subcentimeter incidental right thyroid nodule. No follow-up imaging recommended. Electronically Signed   By: Baldemar Lenis M.D.   On: 09/05/2022 13:53   MR BRAIN W CONTRAST  Result Date: 09/04/2022 CLINICAL DATA:  Initial evaluation for headache.  Neuro deficit. EXAM: MRI HEAD WITH CONTRAST TECHNIQUE: Multiplanar, multiecho pulse sequences of the brain and surrounding structures were obtained with intravenous contrast. CONTRAST:  7mL GADAVIST GADOBUTROL 1 MMOL/ML IV SOLN COMPARISON:  Prior noncontrast brain MRI from earlier the same day. FINDINGS: Brain: Limited postcontrast imaging of the brain  demonstrates no abnormal or pathologic enhancement. No mass lesion, mass effect or midline shift. No hydrocephalus or extra-axial fluid collection. No other new finding. Vascular: Normal intravascular enhancement seen throughout the major intracranial vascular structures. Skull and upper cervical spine: No new finding. Sinuses/Orbits: No new finding. Other: None. IMPRESSION: Negative postcontrast MRI of the brain. No abnormal or pathologic enhancement. Electronically Signed   By: Rise Mu M.D.   On: 09/04/2022 18:48   VAS [place] LOWER EXTREMITY VENOUS (DVT)  Result Date: 09/04/2022  Lower Venous DVT Study Patient Name:  Marissa [name] [facility]  Date of Exam:   09/04/2022 Medical Rec #: [id number]                    Accession #:    [id number] Date of Birth: [birth year]                    Patient Gender: F Patient Age:   16 years Exam Location:  [facility] Procedure:      VAS [place] LOWER EXTREMITY VENOUS (DVT) Referring Phys: Leticia Penna --------------------------------------------------------------------------------  Indications: Stroke.  Comparison Study: No previous exams Performing Technologist: Jody Hill RVT, RDMS  Examination Guidelines: A complete evaluation includes B-mode imaging, spectral Doppler, color Doppler, and power Doppler as needed of all accessible portions of each vessel. Bilateral testing is considered an integral part of a complete examination. Limited examinations for reoccurring indications may be performed as noted. The reflux portion of the exam is performed with the patient in reverse Trendelenburg.  +---------+---------------+---------+-----------+----------+-------------------+ RIGHT    CompressibilityPhasicitySpontaneityPropertiesThrombus Aging      +---------+---------------+---------+-----------+----------+-------------------+ CFV      Full           Yes      Yes                                       +---------+---------------+---------+-----------+----------+-------------------+ SFJ      Full                                                             +---------+---------------+---------+-----------+----------+-------------------+  FV Prox  Full           Yes      Yes                                      +---------+---------------+---------+-----------+----------+-------------------+ FV Mid   Full           Yes      Yes                                      +---------+---------------+---------+-----------+----------+-------------------+ FV DistalFull           Yes      Yes                                      +---------+---------------+---------+-----------+----------+-------------------+ PFV      Full                                                             +---------+---------------+---------+-----------+----------+-------------------+ POP      Full           Yes      Yes                                      +---------+---------------+---------+-----------+----------+-------------------+ PTV      Full                                                             +---------+---------------+---------+-----------+----------+-------------------+ PERO     Full                                         Not well visualized +---------+---------------+---------+-----------+----------+-------------------+   +---------+---------------+---------+-----------+----------+--------------+ LEFT     CompressibilityPhasicitySpontaneityPropertiesThrombus Aging +---------+---------------+---------+-----------+----------+--------------+ CFV      Full           Yes      Yes                                 +---------+---------------+---------+-----------+----------+--------------+ SFJ      Full                                                        +---------+---------------+---------+-----------+----------+--------------+ FV Prox  Full           Yes       Yes                                 +---------+---------------+---------+-----------+----------+--------------+  FV Mid   Full           Yes      Yes                                 +---------+---------------+---------+-----------+----------+--------------+ FV DistalFull           Yes      Yes                                 +---------+---------------+---------+-----------+----------+--------------+ PFV      Full                                                        +---------+---------------+---------+-----------+----------+--------------+ POP      Full           Yes      Yes                                 +---------+---------------+---------+-----------+----------+--------------+ PTV      Full                                                        +---------+---------------+---------+-----------+----------+--------------+ PERO     Full                                                        +---------+---------------+---------+-----------+----------+--------------+     Summary: BILATERAL: - No evidence of deep vein thrombosis seen in the lower extremities, bilaterally. -No evidence of popliteal cyst, bilaterally.   *See table(s) above for measurements and observations.    Preliminary    ECHOCARDIOGRAM COMPLETE  Result Date: 09/04/2022    ECHOCARDIOGRAM REPORT   Patient Name:   Marissa [name] [facility] Date of Exam: 09/04/2022 Medical Rec #:  [id number]                   Height:       61.0 in Accession #:    [id number]                  Weight:       174.2 lb Date of Birth:  05-Feb-1976                   BSA:          1.781 m Patient Age:    46 years                    BP:           137/39 mmHg Patient Gender: F                           HR:           67 bpm. Exam Location:  Inpatient Procedure: 2D Echo, Cardiac Doppler and Color Doppler Indications:    Stroke I63.9  History:        Patient has prior history of Echocardiogram examinations, most                 recent  05/20/2022. Stroke; Risk Factors:Non-Smoker.  Sonographer:    Dondra Prader RVT RCS Referring Phys: [id number] Ernie Avena IMPRESSIONS  1. Left ventricular ejection fraction, by estimation, is 55 to 60%. The left ventricle has normal function. The left ventricle has no regional wall motion abnormalities. Left ventricular diastolic parameters are consistent with Grade I diastolic dysfunction (impaired relaxation).  2. Right ventricular systolic function is normal. The right ventricular size is normal. There is normal pulmonary artery systolic pressure. The estimated right ventricular systolic pressure is 20.1 mmHg.  3. The mitral valve is grossly normal. Trivial mitral valve regurgitation. No evidence of mitral stenosis.  4. The aortic valve is tricuspid. Aortic valve regurgitation is trivial. No aortic stenosis is present.  5. The inferior vena cava is dilated in size with >50% respiratory variability, suggesting right atrial pressure of 8 mmHg. Comparison(s): Prior images unable to be directly viewed, comparison made by report only. No significant change from prior study. Conclusion(s)/Recommendation(s): No intracardiac source of embolism detected on this transthoracic study. Consider a transesophageal echocardiogram to exclude cardiac source of embolism if clinically indicated. FINDINGS  Left Ventricle: Left ventricular ejection fraction, by estimation, is 55 to 60%. The left ventricle has normal function. The left ventricle has no regional wall motion abnormalities. The left ventricular internal cavity size was normal in size. There is  no left ventricular hypertrophy. Left ventricular diastolic parameters are consistent with Grade I diastolic dysfunction (impaired relaxation). Right Ventricle: The right ventricular size is normal. No increase in right ventricular wall thickness. Right ventricular systolic function is normal. There is normal pulmonary artery systolic pressure. The tricuspid regurgitant velocity is  1.74 m/s, and  with an assumed right atrial pressure of 8 mmHg, the estimated right ventricular systolic pressure is 20.1 mmHg. Left Atrium: Left atrial size was normal in size. Right Atrium: Right atrial size was normal in size. Pericardium: Trivial pericardial effusion is present. Mitral Valve: The mitral valve is grossly normal. Trivial mitral valve regurgitation. No evidence of mitral valve stenosis. Tricuspid Valve: The tricuspid valve is grossly normal. Tricuspid valve regurgitation is trivial. No evidence of tricuspid stenosis. Aortic Valve: The aortic valve is tricuspid. Aortic valve regurgitation is trivial. No aortic stenosis is present. Aortic valve mean gradient measures 2.0 mmHg. Aortic valve peak gradient measures 4.4 mmHg. Aortic valve area, by VTI measures 2.91 cm. Pulmonic Valve: The pulmonic valve was grossly normal. Pulmonic valve regurgitation is trivial. No evidence of pulmonic stenosis. Aorta: The aortic root and ascending aorta are structurally normal, with no evidence of dilitation. Venous: The inferior vena cava is dilated in size with greater than 50% respiratory variability, suggesting right atrial pressure of 8 mmHg. IAS/Shunts: The atrial septum is grossly normal.  LEFT VENTRICLE PLAX 2D LVIDd:         4.20 cm   Diastology LVIDs:         2.40 cm   LV e' medial:    5.55 cm/s LV PW:         1.10 cm   LV E/e' medial:  11.9 LV IVS:        0.90 cm   LV e' lateral:   11.10 cm/s LVOT diam:     2.00 cm  LV E/e' lateral: 5.9 LV SV:         67 LV SV Index:   37 LVOT Area:     3.14 cm  RIGHT VENTRICLE             IVC RV Basal diam:  3.30 cm     IVC diam: 2.10 cm RV Mid diam:    2.60 cm RV S prime:     14.60 cm/s TAPSE (M-mode): 2.7 cm LEFT ATRIUM             Index        RIGHT ATRIUM           Index LA diam:        3.80 cm 2.13 cm/m   RA Area:     10.40 cm LA Vol (A2C):   52.7 ml 29.59 ml/m  RA Volume:   22.90 ml  12.86 ml/m LA Vol (A4C):   59.5 ml 33.41 ml/m LA Biplane Vol: 61.4 ml 34.47  ml/m  AORTIC VALVE                    PULMONIC VALVE AV Area (Vmax):    2.92 cm     PV Vmax:       0.92 m/s AV Area (Vmean):   2.92 cm     PV Peak grad:  3.4 mmHg AV Area (VTI):     2.91 cm AV Vmax:           105.00 cm/s AV Vmean:          67.200 cm/s AV VTI:            0.229 m AV Peak Grad:      4.4 mmHg AV Mean Grad:      2.0 mmHg LVOT Vmax:         97.50 cm/s LVOT Vmean:        62.400 cm/s LVOT VTI:          0.212 m LVOT/AV VTI ratio: 0.93  AORTA Ao Root diam: 2.80 cm Ao Asc diam:  3.30 cm MITRAL VALVE               TRICUSPID VALVE MV Area (PHT): 3.34 cm    TR Peak grad:   12.1 mmHg MV Decel Time: 227 msec    TR Vmax:        174.00 cm/s MV E velocity: 66.00 cm/s MV A velocity: 66.00 cm/s  SHUNTS MV E/A ratio:  1.00        Systemic VTI:  0.21 m                            Systemic Diam: 2.00 cm Lennie Odor MD Electronically signed by Lennie Odor MD Signature Date/Time: 09/04/2022/3:09:34 PM    Final    MR ANGIO HEAD WO CONTRAST  Result Date: 09/04/2022 CLINICAL DATA:  Provided history: Stroke, follow-up EXAM: MRI HEAD WITHOUT CONTRAST MRA HEAD WITHOUT CONTRAST TECHNIQUE: Multiplanar, multi-echo pulse sequences of the brain and surrounding structures were acquired without intravenous contrast. Angiographic images of the Circle of Willis were acquired using MRA technique without intravenous contrast. COMPARISON:  No pertinent prior exams available for comparison. FINDINGS: MRI HEAD FINDINGS Brain: No age advanced or lobar predominant parenchymal atrophy. 3 mm round focus of diffusion-weighted and T2 FLAIR hyperintense signal abnormality within the subcortical white matter of the posterior right frontal lobe consistent (for instance as seen on  series 2, image 35). 7 mm curvilinear/crescent-shaped focus of diffusion-weighted and T2 FLAIR hyperintense signal abnormality within the right parietal lobe subcortical white matter (for instance as seen on series 2, image 31). No evidence of an intracranial mass.  No chronic intracranial blood products. No extra-axial fluid collection. No midline shift. Vascular: Maintained flow voids within the proximal large arterial vessels. Skull and upper cervical spine: No focal suspicious marrow lesion. Sinuses/Orbits: No mass or acute finding within the imaged orbits. No significant paranasal sinus disease. MRA HEAD FINDINGS Anterior circulation: The intracranial internal carotid arteries are patent. The M1 middle cerebral arteries are patent. Somewhat early right MCA bifurcation. No M2 proximal branch occlusion or high-grade proximal stenosis. The anterior cerebral arteries are patent. No intracranial aneurysm is identified. Posterior circulation: The intracranial vertebral arteries are patent. The basilar artery is patent. The posterior cerebral arteries are patent. Posterior communicating arteries are diminutive or absent bilaterally. Anatomic variants: As described IMPRESSION: MRI brain: 1. Two subcentimeter foci of signal abnormality within the right frontal and right parietal lobe white matter, as described. While these could reflect acute/early subacute infarcts, the appearance is somewhat atypical and alternative considerations include an infectious/inflammatory process (including neurocysticercosis) and demyelinating disease. Post-contrast MR imaging of the brain is recommended for further characterization. 2. Otherwise unremarkable non-contrast MRI appearance of the brain. MRA head: No intracranial large vessel occlusion or proximal high-grade arterial stenosis identified. Electronically Signed   By: Jackey Loge D.O.   On: 09/04/2022 13:57   MR BRAIN WO CONTRAST  Result Date: 09/04/2022 CLINICAL DATA:  Provided history: Stroke, follow-up EXAM: MRI HEAD WITHOUT CONTRAST MRA HEAD WITHOUT CONTRAST TECHNIQUE: Multiplanar, multi-echo pulse sequences of the brain and surrounding structures were acquired without intravenous contrast. Angiographic images of the Circle of Willis  were acquired using MRA technique without intravenous contrast. COMPARISON:  No pertinent prior exams available for comparison. FINDINGS: MRI HEAD FINDINGS Brain: No age advanced or lobar predominant parenchymal atrophy. 3 mm round focus of diffusion-weighted and T2 FLAIR hyperintense signal abnormality within the subcortical white matter of the posterior right frontal lobe consistent (for instance as seen on series 2, image 35). 7 mm curvilinear/crescent-shaped focus of diffusion-weighted and T2 FLAIR hyperintense signal abnormality within the right parietal lobe subcortical white matter (for instance as seen on series 2, image 31). No evidence of an intracranial mass. No chronic intracranial blood products. No extra-axial fluid collection. No midline shift. Vascular: Maintained flow voids within the proximal large arterial vessels. Skull and upper cervical spine: No focal suspicious marrow lesion. Sinuses/Orbits: No mass or acute finding within the imaged orbits. No significant paranasal sinus disease. MRA HEAD FINDINGS Anterior circulation: The intracranial internal carotid arteries are patent. The M1 middle cerebral arteries are patent. Somewhat early right MCA bifurcation. No M2 proximal branch occlusion or high-grade proximal stenosis. The anterior cerebral arteries are patent. No intracranial aneurysm is identified. Posterior circulation: The intracranial vertebral arteries are patent. The basilar artery is patent. The posterior cerebral arteries are patent. Posterior communicating arteries are diminutive or absent bilaterally. Anatomic variants: As described IMPRESSION: MRI brain: 1. Two subcentimeter foci of signal abnormality within the right frontal and right parietal lobe white matter, as described. While these could reflect acute/early subacute infarcts, the appearance is somewhat atypical and alternative considerations include an infectious/inflammatory process (including neurocysticercosis) and  demyelinating disease. Post-contrast MR imaging of the brain is recommended for further characterization. 2. Otherwise unremarkable non-contrast MRI appearance of the brain. MRA head: No intracranial large vessel occlusion or  proximal high-grade arterial stenosis identified. Electronically Signed   By: Jackey Loge D.O.   On: 09/04/2022 13:57     Discharge Instructions: Discharge Instructions     Ambulatory referral to Neurology   Complete by: As directed    An appointment is requested in approximately: 8 weeks for stroke follow up   Ambulatory referral to Speech Therapy   Complete by: As directed    Call MD for:  difficulty breathing, headache or visual disturbances   Complete by: As directed    Call MD for:  hives   Complete by: As directed    Call MD for:  persistant dizziness or light-headedness   Complete by: As directed    Call MD for:  persistant nausea and vomiting   Complete by: As directed    Call MD for:  redness, tenderness, or signs of infection (pain, swelling, redness, odor or green/yellow discharge around incision site)   Complete by: As directed    Call MD for:  severe uncontrolled pain   Complete by: As directed    Call MD for:  temperature >100.4   Complete by: As directed    Diet - low sodium heart healthy   Complete by: As directed    Discharge instructions   Complete by: As directed    Ms. Marissa [name], It was a pleasure taking care of you at [facility]. You were admitted for a stroke and treated with aspirin and plavix. We are discharging you home now that you are doing better. Please follow the following instructions.   1) MRI did show a stroke, and we are discharging you on dual antiplatelet therapy with aspirin and Plavix for the next 21 days.  Please take aspirin 81 mg daily, and Plavix 75 mg daily for the next 21 days.  After 21 days, stop taking Plavix and continue aspirin 81 mg daily indefinitely.  We also have started you on  rosuvastatin, which will help lower your cholesterol.  Please take rosuvastatin 20 mg daily.  2) We are sending you home on an EpiPen as well, as we think this could be also related to anaphylaxis.  Please use this if you feel like you are about to pass out and develop hives.  3) Please follow-up with your primary care physician in 1 week.  You also have an appointment with your heart doctor on 09/12/2022.  Please show up to this appointment.  4) You should ask your doctor to see if you need to be tested for carcinoid syndrome.  Continue following with your allergist.  Take care,  Dr. Modena Slater, DO   Increase activity slowly   Complete by: As directed        Signed: Modena Slater, DO 09/05/2022, 4:07 PM   Pager: [phone]

## 2022-09-05 NOTE — TOC Transition Note (Signed)
Transition of Care [facility]) - CM/SW Discharge Note   Patient Details  Name: Marissa [name] MRN: [id number] Date of Birth: July 14, 1975  Transition of Care [facility] CM/SW Contact:  Kermit Balo, RN Phone Number: 09/05/2022, 4:03 PM   Clinical Narrative:    Pt is discharging home with outpatient therapy through [facility]. Orders in Epic and referral sent. Information on the AVS for her to call and schedule the first appointment.  Pt has transportation home.   Final next level of care: OP Rehab Barriers to Discharge: No Barriers Identified   Patient Goals and CMS Choice   Choice offered to / list presented to : Patient  Discharge Placement                         Discharge Plan and Services Additional resources added to the After Visit Summary for                                       Social Determinants of Health (SDOH) Interventions SDOH Screenings   Food Insecurity: No Food Insecurity (09/04/2022)  Housing: Low Risk  (09/04/2022)  Transportation Needs: No Transportation Needs (09/04/2022)  Utilities: Not At Risk (09/04/2022)  Depression (PHQ2-9): Medium Risk (01/18/2019)  Tobacco Use: Low Risk  (09/04/2022)     Readmission Risk Interventions     No data to display

## 2022-09-05 NOTE — Procedures (Signed)
Patient Name: Marissa [name]  MRN: [id number]  Epilepsy Attending: Charlsie Quest  Referring Physician/Provider: Marvel Plan, MD  Date: 09/05/2022 Duration: 22.24 mins  Patient history: 47yo F with recurrent syncope getting eeg to evaluate for seizure.  Level of alertness: Awake, asleep  AEDs during EEG study: None  Technical aspects: This EEG study was done with scalp electrodes positioned according to the 10-20 International system of electrode placement. Electrical activity was reviewed with band pass filter of 1-70Hz , sensitivity of 7 uV/mm, display speed of 25mm/sec with a 60Hz  notched filter applied as appropriate. EEG data were recorded continuously and digitally stored.  Video monitoring was available and reviewed as appropriate.  Description: The posterior dominant rhythm consists of 9-10 Hz activity of moderate voltage (25-35 uV) seen predominantly in posterior head regions, symmetric and reactive to eye opening and eye closing. Sleep was characterized by vertex waves, sleep spindles (12 to 14 Hz), maximal frontocentral region. Hyperventilation and photic stimulation were not performed.     IMPRESSION: This study is within normal limits. No seizures or epileptiform discharges were seen throughout the recording.  A normal interictal EEG does not exclude the diagnosis of epilepsy.  Marissa [name] [name]

## 2022-09-05 NOTE — Plan of Care (Signed)

## 2022-09-05 NOTE — Progress Notes (Signed)
EEG complete - results pending 

## 2022-09-05 NOTE — Progress Notes (Addendum)
STROKE TEAM PROGRESS NOTE   SUBJECTIVE (INTERVAL HISTORY) No family is at the bedside.  Pt lying in bed, neuro intact. MRI showed incidental infarcts. Had EEG and TCD bubble study, negative. Discussed with Dr. Jacinto Halim, will recommend outpt TEE and loop.   OBJECTIVE Temp:  [97.8 F (36.6 C)-98.6 F (37 C)] 98.6 F (37 C) (06/12 1554) Pulse Rate:  [64-79] 79 (06/12 1554) Cardiac Rhythm: Normal sinus rhythm (06/12 0704) Resp:  [18] 18 (06/12 1554) BP: (101-147)/(61-90) 107/71 (06/12 1554) SpO2:  [98 %-100 %] 100 % (06/12 1554)  Recent Labs  Lab 09/04/22 0937  GLUCAP 91   Recent Labs  Lab 09/04/22 0933 09/05/22 0254  NA 137 138  K 3.3* 3.7  CL 104 105  CO2 24 24  GLUCOSE 88 108*  BUN 8 12  CREATININE 0.97 0.94  CALCIUM 9.3 9.0  MG  --  1.8   Recent Labs  Lab 09/04/22 0933  AST 17  ALT 15  ALKPHOS 89  BILITOT 0.6  PROT 8.0  ALBUMIN 4.3   Recent Labs  Lab 09/04/22 0933  WBC 7.8  NEUTROABS 4.8  HGB 14.5  HCT 43.8  MCV 88.0  PLT 286   No results for input(s): "CKTOTAL", "CKMB", "CKMBINDEX", "TROPONINI" in the last 168 hours. No results for input(s): "LABPROT", "INR" in the last 72 hours. Recent Labs    09/04/22 1015  COLORURINE STRAW*  LABSPEC 1.004*  PHURINE 6.0  GLUCOSEU NEGATIVE  HGBUR NEGATIVE  BILIRUBINUR NEGATIVE  KETONESUR NEGATIVE  PROTEINUR NEGATIVE  NITRITE NEGATIVE  LEUKOCYTESUR NEGATIVE       Component Value Date/Time   CHOL 172 09/05/2022 0254   CHOL 179 01/29/2018 0930   TRIG 55 09/05/2022 0254   HDL 53 09/05/2022 0254   HDL 44 01/29/2018 0930   CHOLHDL 3.2 09/05/2022 0254   VLDL 11 09/05/2022 0254   LDLCALC 108 (H) 09/05/2022 0254   LDLCALC 121 (H) 01/29/2018 0930   Lab Results  Component Value Date   HGBA1C 4.7 (L) 09/04/2022      Component Value Date/Time   LABOPIA NONE DETECTED 09/05/2022 1255   COCAINSCRNUR NONE DETECTED 09/05/2022 1255   LABBENZ NONE DETECTED 09/05/2022 1255   AMPHETMU NONE DETECTED 09/05/2022 1255    THCU NONE DETECTED 09/05/2022 1255   LABBARB NONE DETECTED 09/05/2022 1255    No results for input(s): "ETH" in the last 168 hours.  I have personally reviewed the radiological images below and agree with the radiology interpretations.  EEG adult  Result Date: 09/05/2022 Charlsie Quest, MD     09/05/2022  3:17 PM Patient Name: Marissa [name] MRN: [id number] Epilepsy Attending: Charlsie Quest Referring Physician/Provider: Marvel Plan, MD Date: 09/05/2022 Duration: 22.24 mins Patient history: 47yo F with recurrent syncope getting eeg to evaluate for seizure. Level of alertness: Awake, asleep AEDs during EEG study: None Technical aspects: This EEG study was done with scalp electrodes positioned according to the 10-20 International system of electrode placement. Electrical activity was reviewed with band pass filter of 1-70Hz , sensitivity of 7 uV/mm, display speed of 51mm/sec with a 60Hz  notched filter applied as appropriate. EEG data were recorded continuously and digitally stored.  Video monitoring was available and reviewed as appropriate. Description: The posterior dominant rhythm consists of 9-10 Hz activity of moderate voltage (25-35 uV) seen predominantly in posterior head regions, symmetric and reactive to eye opening and eye closing. Sleep was characterized by vertex waves, sleep spindles (12 to 14 Hz), maximal frontocentral region. Hyperventilation  and photic stimulation were not performed.   IMPRESSION: This study is within normal limits. No seizures or epileptiform discharges were seen throughout the recording. A normal interictal EEG does not exclude the diagnosis of epilepsy. Charlsie Quest   CT ANGIO HEAD NECK W WO CM  Result Date: 09/05/2022 CLINICAL DATA:  Stroke/TIA.  The term and embolic source. EXAM: CT ANGIOGRAPHY HEAD AND NECK WITH AND WITHOUT CONTRAST TECHNIQUE: Multidetector CT imaging of the head and neck was performed using the standard protocol during bolus  administration of intravenous contrast. Multiplanar CT image reconstructions and MIPs were obtained to evaluate the vascular anatomy. Carotid stenosis measurements (when applicable) are obtained utilizing NASCET criteria, using the distal internal carotid diameter as the denominator. RADIATION DOSE REDUCTION: This exam was performed according to the departmental dose-optimization program which includes automated exposure control, adjustment of the mA and/or kV according to patient size and/or use of iterative reconstruction technique. CONTRAST:  75mL OMNIPAQUE IOHEXOL 350 MG/ML SOLN COMPARISON:  MRI of the brain September 04, 2022. FINDINGS: CT HEAD FINDINGS Brain: No evidence of acute infarction, hemorrhage, hydrocephalus, extra-axial collection or mass lesion/mass effect. Small foci of restricted diffusion on prior MRI are too small to be accurately characterized on CT. Vascular: No hyperdense vessel or unexpected calcification. Skull: Normal. Negative for fracture or focal lesion. Sinuses/Orbits: No acute finding. Other: Enlarged, partial empty sella. Review of the MIP images confirms the above findings CTA NECK FINDINGS Aortic arch: Imaged portion shows no evidence of aneurysm or dissection. No significant stenosis of the major arch vessel origins. The innominate and left common carotid artery have a common origin from the aortic arch. Right carotid system: No evidence of dissection, stenosis (50% or greater), or occlusion. Left carotid system: No evidence of dissection, stenosis (50% or greater), or occlusion. Vertebral arteries: Codominant. No evidence of dissection, stenosis (50% or greater), or occlusion. Skeleton: Negative. Other neck: A 6 mm right thyroid lobe nodule. No follow-up image recommended. Upper chest: Negative. Review of the MIP images confirms the above findings CTA HEAD FINDINGS Anterior circulation: No significant stenosis, proximal occlusion, aneurysm, or vascular malformation. Posterior  circulation: No significant stenosis, proximal occlusion, aneurysm, or vascular malformation. Venous sinuses: As permitted by contrast timing, patent. Anatomic variants: None significant. Review of the MIP images confirms the above findings IMPRESSION: 1. No acute intracranial abnormality. 2. No intracranial large vessel occlusion or significant stenosis. 3. No hemodynamically significant stenosis in the neck. 4. Enlarged, partial empty sella, which is a nonspecific finding but can be seen in the setting of idiopathic intracranial hypertension (pseudotumor cerebri). 5. Subcentimeter incidental right thyroid nodule. No follow-up imaging recommended. Electronically Signed   By: Baldemar Lenis M.D.   On: 09/05/2022 13:53   MR BRAIN W CONTRAST  Result Date: 09/04/2022 CLINICAL DATA:  Initial evaluation for headache.  Neuro deficit. EXAM: MRI HEAD WITH CONTRAST TECHNIQUE: Multiplanar, multiecho pulse sequences of the brain and surrounding structures were obtained with intravenous contrast. CONTRAST:  7mL GADAVIST GADOBUTROL 1 MMOL/ML IV SOLN COMPARISON:  Prior noncontrast brain MRI from earlier the same day. FINDINGS: Brain: Limited postcontrast imaging of the brain demonstrates no abnormal or pathologic enhancement. No mass lesion, mass effect or midline shift. No hydrocephalus or extra-axial fluid collection. No other new finding. Vascular: Normal intravascular enhancement seen throughout the major intracranial vascular structures. Skull and upper cervical spine: No new finding. Sinuses/Orbits: No new finding. Other: None. IMPRESSION: Negative postcontrast MRI of the brain. No abnormal or pathologic enhancement. Electronically Signed  By: Rise Mu M.D.   On: 09/04/2022 18:48   VAS [place] LOWER EXTREMITY VENOUS (DVT)  Result Date: 09/04/2022  Lower Venous DVT Study Patient Name:  Marissa [name] [facility]  Date of Exam:   09/04/2022 Medical Rec #: [id number]                    Accession  #:    [id number] Date of Birth: 1975-09-09                    Patient Gender: F Patient Age:   25 years Exam Location:  [facility] Procedure:      VAS [place] LOWER EXTREMITY VENOUS (DVT) Referring Phys: Leticia Penna --------------------------------------------------------------------------------  Indications: Stroke.  Comparison Study: No previous exams Performing Technologist: Jody Hill RVT, RDMS  Examination Guidelines: A complete evaluation includes B-mode imaging, spectral Doppler, color Doppler, and power Doppler as needed of all accessible portions of each vessel. Bilateral testing is considered an integral part of a complete examination. Limited examinations for reoccurring indications may be performed as noted. The reflux portion of the exam is performed with the patient in reverse Trendelenburg.  +---------+---------------+---------+-----------+----------+-------------------+ RIGHT    CompressibilityPhasicitySpontaneityPropertiesThrombus Aging      +---------+---------------+---------+-----------+----------+-------------------+ CFV      Full           Yes      Yes                                      +---------+---------------+---------+-----------+----------+-------------------+ SFJ      Full                                                             +---------+---------------+---------+-----------+----------+-------------------+ FV Prox  Full           Yes      Yes                                      +---------+---------------+---------+-----------+----------+-------------------+ FV Mid   Full           Yes      Yes                                      +---------+---------------+---------+-----------+----------+-------------------+ FV DistalFull           Yes      Yes                                      +---------+---------------+---------+-----------+----------+-------------------+ PFV      Full                                                              +---------+---------------+---------+-----------+----------+-------------------+ POP      Full  Yes      Yes                                      +---------+---------------+---------+-----------+----------+-------------------+ PTV      Full                                                             +---------+---------------+---------+-----------+----------+-------------------+ PERO     Full                                         Not well visualized +---------+---------------+---------+-----------+----------+-------------------+   +---------+---------------+---------+-----------+----------+--------------+ LEFT     CompressibilityPhasicitySpontaneityPropertiesThrombus Aging +---------+---------------+---------+-----------+----------+--------------+ CFV      Full           Yes      Yes                                 +---------+---------------+---------+-----------+----------+--------------+ SFJ      Full                                                        +---------+---------------+---------+-----------+----------+--------------+ FV Prox  Full           Yes      Yes                                 +---------+---------------+---------+-----------+----------+--------------+ FV Mid   Full           Yes      Yes                                 +---------+---------------+---------+-----------+----------+--------------+ FV DistalFull           Yes      Yes                                 +---------+---------------+---------+-----------+----------+--------------+ PFV      Full                                                        +---------+---------------+---------+-----------+----------+--------------+ POP      Full           Yes      Yes                                 +---------+---------------+---------+-----------+----------+--------------+ PTV      Full                                                         +---------+---------------+---------+-----------+----------+--------------+  PERO     Full                                                        +---------+---------------+---------+-----------+----------+--------------+     Summary: BILATERAL: - No evidence of deep vein thrombosis seen in the lower extremities, bilaterally. -No evidence of popliteal cyst, bilaterally.   *See table(s) above for measurements and observations.    Preliminary    ECHOCARDIOGRAM COMPLETE  Result Date: 09/04/2022    ECHOCARDIOGRAM REPORT   Patient Name:   Marissa [name] [facility] Date of Exam: 09/04/2022 Medical Rec #:  [id number]                   Height:       61.0 in Accession #:    [id number]                  Weight:       174.2 lb Date of Birth:  10/20/1975                   BSA:          1.781 m Patient Age:    46 years                    BP:           137/39 mmHg Patient Gender: F                           HR:           67 bpm. Exam Location:  Inpatient Procedure: 2D Echo, Cardiac Doppler and Color Doppler Indications:    Stroke I63.9  History:        Patient has prior history of Echocardiogram examinations, most                 recent 05/20/2022. Stroke; Risk Factors:Non-Smoker.  Sonographer:    Dondra Prader RVT RCS Referring Phys: [id number] Ernie Avena IMPRESSIONS  1. Left ventricular ejection fraction, by estimation, is 55 to 60%. The left ventricle has normal function. The left ventricle has no regional wall motion abnormalities. Left ventricular diastolic parameters are consistent with Grade I diastolic dysfunction (impaired relaxation).  2. Right ventricular systolic function is normal. The right ventricular size is normal. There is normal pulmonary artery systolic pressure. The estimated right ventricular systolic pressure is 20.1 mmHg.  3. The mitral valve is grossly normal. Trivial mitral valve regurgitation. No evidence of mitral stenosis.  4. The aortic valve is tricuspid. Aortic valve regurgitation is trivial.  No aortic stenosis is present.  5. The inferior vena cava is dilated in size with >50% respiratory variability, suggesting right atrial pressure of 8 mmHg. Comparison(s): Prior images unable to be directly viewed, comparison made by report only. No significant change from prior study. Conclusion(s)/Recommendation(s): No intracardiac source of embolism detected on this transthoracic study. Consider a transesophageal echocardiogram to exclude cardiac source of embolism if clinically indicated. FINDINGS  Left Ventricle: Left ventricular ejection fraction, by estimation, is 55 to 60%. The left ventricle has normal function. The left ventricle has no regional wall motion abnormalities. The left ventricular internal cavity size was normal in size. There is  no left ventricular hypertrophy. Left ventricular diastolic parameters are consistent  with Grade I diastolic dysfunction (impaired relaxation). Right Ventricle: The right ventricular size is normal. No increase in right ventricular wall thickness. Right ventricular systolic function is normal. There is normal pulmonary artery systolic pressure. The tricuspid regurgitant velocity is 1.74 m/s, and  with an assumed right atrial pressure of 8 mmHg, the estimated right ventricular systolic pressure is 20.1 mmHg. Left Atrium: Left atrial size was normal in size. Right Atrium: Right atrial size was normal in size. Pericardium: Trivial pericardial effusion is present. Mitral Valve: The mitral valve is grossly normal. Trivial mitral valve regurgitation. No evidence of mitral valve stenosis. Tricuspid Valve: The tricuspid valve is grossly normal. Tricuspid valve regurgitation is trivial. No evidence of tricuspid stenosis. Aortic Valve: The aortic valve is tricuspid. Aortic valve regurgitation is trivial. No aortic stenosis is present. Aortic valve mean gradient measures 2.0 mmHg. Aortic valve peak gradient measures 4.4 mmHg. Aortic valve area, by VTI measures 2.91 cm. Pulmonic  Valve: The pulmonic valve was grossly normal. Pulmonic valve regurgitation is trivial. No evidence of pulmonic stenosis. Aorta: The aortic root and ascending aorta are structurally normal, with no evidence of dilitation. Venous: The inferior vena cava is dilated in size with greater than 50% respiratory variability, suggesting right atrial pressure of 8 mmHg. IAS/Shunts: The atrial septum is grossly normal.  LEFT VENTRICLE PLAX 2D LVIDd:         4.20 cm   Diastology LVIDs:         2.40 cm   LV e' medial:    5.55 cm/s LV PW:         1.10 cm   LV E/e' medial:  11.9 LV IVS:        0.90 cm   LV e' lateral:   11.10 cm/s LVOT diam:     2.00 cm   LV E/e' lateral: 5.9 LV SV:         67 LV SV Index:   37 LVOT Area:     3.14 cm  RIGHT VENTRICLE             IVC RV Basal diam:  3.30 cm     IVC diam: 2.10 cm RV Mid diam:    2.60 cm RV S prime:     14.60 cm/s TAPSE (M-mode): 2.7 cm LEFT ATRIUM             Index        RIGHT ATRIUM           Index LA diam:        3.80 cm 2.13 cm/m   RA Area:     10.40 cm LA Vol (A2C):   52.7 ml 29.59 ml/m  RA Volume:   22.90 ml  12.86 ml/m LA Vol (A4C):   59.5 ml 33.41 ml/m LA Biplane Vol: 61.4 ml 34.47 ml/m  AORTIC VALVE                    PULMONIC VALVE AV Area (Vmax):    2.92 cm     PV Vmax:       0.92 m/s AV Area (Vmean):   2.92 cm     PV Peak grad:  3.4 mmHg AV Area (VTI):     2.91 cm AV Vmax:           105.00 cm/s AV Vmean:          67.200 cm/s AV VTI:            0.229 m  AV Peak Grad:      4.4 mmHg AV Mean Grad:      2.0 mmHg LVOT Vmax:         97.50 cm/s LVOT Vmean:        62.400 cm/s LVOT VTI:          0.212 m LVOT/AV VTI ratio: 0.93  AORTA Ao Root diam: 2.80 cm Ao Asc diam:  3.30 cm MITRAL VALVE               TRICUSPID VALVE MV Area (PHT): 3.34 cm    TR Peak grad:   12.1 mmHg MV Decel Time: 227 msec    TR Vmax:        174.00 cm/s MV E velocity: 66.00 cm/s MV A velocity: 66.00 cm/s  SHUNTS MV E/A ratio:  1.00        Systemic VTI:  0.21 m                            Systemic Diam:  2.00 cm Lennie Odor MD Electronically signed by Lennie Odor MD Signature Date/Time: 09/04/2022/3:09:34 PM    Final    MR ANGIO HEAD WO CONTRAST  Result Date: 09/04/2022 CLINICAL DATA:  Provided history: Stroke, follow-up EXAM: MRI HEAD WITHOUT CONTRAST MRA HEAD WITHOUT CONTRAST TECHNIQUE: Multiplanar, multi-echo pulse sequences of the brain and surrounding structures were acquired without intravenous contrast. Angiographic images of the Circle of Willis were acquired using MRA technique without intravenous contrast. COMPARISON:  No pertinent prior exams available for comparison. FINDINGS: MRI HEAD FINDINGS Brain: No age advanced or lobar predominant parenchymal atrophy. 3 mm round focus of diffusion-weighted and T2 FLAIR hyperintense signal abnormality within the subcortical white matter of the posterior right frontal lobe consistent (for instance as seen on series 2, image 35). 7 mm curvilinear/crescent-shaped focus of diffusion-weighted and T2 FLAIR hyperintense signal abnormality within the right parietal lobe subcortical white matter (for instance as seen on series 2, image 31). No evidence of an intracranial mass. No chronic intracranial blood products. No extra-axial fluid collection. No midline shift. Vascular: Maintained flow voids within the proximal large arterial vessels. Skull and upper cervical spine: No focal suspicious marrow lesion. Sinuses/Orbits: No mass or acute finding within the imaged orbits. No significant paranasal sinus disease. MRA HEAD FINDINGS Anterior circulation: The intracranial internal carotid arteries are patent. The M1 middle cerebral arteries are patent. Somewhat early right MCA bifurcation. No M2 proximal branch occlusion or high-grade proximal stenosis. The anterior cerebral arteries are patent. No intracranial aneurysm is identified. Posterior circulation: The intracranial vertebral arteries are patent. The basilar artery is patent. The posterior cerebral arteries are  patent. Posterior communicating arteries are diminutive or absent bilaterally. Anatomic variants: As described IMPRESSION: MRI brain: 1. Two subcentimeter foci of signal abnormality within the right frontal and right parietal lobe white matter, as described. While these could reflect acute/early subacute infarcts, the appearance is somewhat atypical and alternative considerations include an infectious/inflammatory process (including neurocysticercosis) and demyelinating disease. Post-contrast MR imaging of the brain is recommended for further characterization. 2. Otherwise unremarkable non-contrast MRI appearance of the brain. MRA head: No intracranial large vessel occlusion or proximal high-grade arterial stenosis identified. Electronically Signed   By: Jackey Loge D.O.   On: 09/04/2022 13:57   MR BRAIN WO CONTRAST  Result Date: 09/04/2022 CLINICAL DATA:  Provided history: Stroke, follow-up EXAM: MRI HEAD WITHOUT CONTRAST MRA HEAD WITHOUT CONTRAST TECHNIQUE: Multiplanar, multi-echo pulse sequences of  the brain and surrounding structures were acquired without intravenous contrast. Angiographic images of the Circle of Willis were acquired using MRA technique without intravenous contrast. COMPARISON:  No pertinent prior exams available for comparison. FINDINGS: MRI HEAD FINDINGS Brain: No age advanced or lobar predominant parenchymal atrophy. 3 mm round focus of diffusion-weighted and T2 FLAIR hyperintense signal abnormality within the subcortical white matter of the posterior right frontal lobe consistent (for instance as seen on series 2, image 35). 7 mm curvilinear/crescent-shaped focus of diffusion-weighted and T2 FLAIR hyperintense signal abnormality within the right parietal lobe subcortical white matter (for instance as seen on series 2, image 31). No evidence of an intracranial mass. No chronic intracranial blood products. No extra-axial fluid collection. No midline shift. Vascular: Maintained flow voids  within the proximal large arterial vessels. Skull and upper cervical spine: No focal suspicious marrow lesion. Sinuses/Orbits: No mass or acute finding within the imaged orbits. No significant paranasal sinus disease. MRA HEAD FINDINGS Anterior circulation: The intracranial internal carotid arteries are patent. The M1 middle cerebral arteries are patent. Somewhat early right MCA bifurcation. No M2 proximal branch occlusion or high-grade proximal stenosis. The anterior cerebral arteries are patent. No intracranial aneurysm is identified. Posterior circulation: The intracranial vertebral arteries are patent. The basilar artery is patent. The posterior cerebral arteries are patent. Posterior communicating arteries are diminutive or absent bilaterally. Anatomic variants: As described IMPRESSION: MRI brain: 1. Two subcentimeter foci of signal abnormality within the right frontal and right parietal lobe white matter, as described. While these could reflect acute/early subacute infarcts, the appearance is somewhat atypical and alternative considerations include an infectious/inflammatory process (including neurocysticercosis) and demyelinating disease. Post-contrast MR imaging of the brain is recommended for further characterization. 2. Otherwise unremarkable non-contrast MRI appearance of the brain. MRA head: No intracranial large vessel occlusion or proximal high-grade arterial stenosis identified. Electronically Signed   By: Jackey Loge D.O.   On: 09/04/2022 13:57     PHYSICAL EXAM  Temp:  [97.8 F (36.6 C)-98.6 F (37 C)] 98.6 F (37 C) (06/12 1554) Pulse Rate:  [64-79] 79 (06/12 1554) Resp:  [18] 18 (06/12 1554) BP: (101-147)/(61-90) 107/71 (06/12 1554) SpO2:  [98 %-100 %] 100 % (06/12 1554)  General - Well nourished, well developed, in no apparent distress.  Ophthalmologic - fundi not visualized due to noncooperation.  Cardiovascular - Regular rhythm and rate.  Mental Status -  Level of arousal  and orientation to time, place, and person were intact. Language including expression, naming, repetition, comprehension was assessed and found intact. Attention span and concentration were normal. Recent and remote memory were intact. Fund of Knowledge was assessed and was intact.  Cranial Nerves II - XII - II - Visual field intact OU. III, IV, VI - Extraocular movements intact. V - Facial sensation intact bilaterally. VII - Facial movement intact bilaterally. VIII - Hearing & vestibular intact bilaterally. X - Palate elevates symmetrically. XI - Chin turning & shoulder shrug intact bilaterally. XII - Tongue protrusion intact.  Motor Strength - The patient's strength was normal in all extremities and pronator drift was absent.  Bulk was normal and fasciculations were absent.   Motor Tone - Muscle tone was assessed at the neck and appendages and was normal.  Reflexes - The patient's reflexes were symmetrical in all extremities and she had no pathological reflexes.  Sensory - Light touch, temperature/pinprick were assessed and were symmetrical.    Coordination - The patient had normal movements in the hands and feet with  no ataxia or dysmetria.  Tremor was absent.  Gait and Station - deferred.   ASSESSMENT/PLAN Marissa [name] is a 47 y.o. female with history of HTN, endometriosis, syncope episodes admitted for incidental stroke on MRI. No tPA given due to no symptoms.    Stroke, incidental finding -  right frontal MCA and ACA territory 2 small/punctate infarcts, embolic pattern, etiology unclear, cryptogenic MRI  Two subcentimeter foci of signal abnormality within the right frontal and right parietal lobe white matter MRA unremarkable CTA head and neck unremarkable 2D Echo EF 55 to 60% LE venous Doppler negative TCD bubble study no PFO EEG normal Recommend outpatient TEE and loop recorder with Dr. Jacinto Halim LDL 108 HgbA1c 4.7 UDS negative ESR/RPR/TSH/B12  unremarkable Hypercoag workup pending Lovenox for VTE prophylaxis No antithrombotic prior to admission, now on aspirin 81 mg daily and clopidogrel 75 mg daily for 3 weeks and then aspirin alone. Patient counseled to be compliant with her antithrombotic medications Ongoing aggressive stroke risk factor management Therapy recommendations: None Disposition: Home  Syncope and presyncope Started 04/2022 with syncope Followed by several episodes of scalp itching, hives, presyncope Follow-up with Dr. Jacinto Halim cardiology Considered vasovagal syncope Recommend outpatient TEE and loop recorder  Hypertension Stable Avoid low BP Long term BP goal normotensive  Hyperlipidemia Home meds: None LDL 108, goal < 70 Now on Crestor 20 Continue statin at discharge  Other Stroke Risk Factors Obesity, Body mass index is 32.91 kg/m.   Other Active Problems Endometriosis  Hospital day # 0  Neurology will sign off. Please call with questions. Pt will follow up her neurologist at [facility] as scheduled. Thanks for the consult.   Marvel Plan, MD PhD Stroke Neurology 09/05/2022 3:57 PM    To contact Stroke Continuity provider, please refer to [email or web address]. After hours, contact General Neurology

## 2022-09-05 NOTE — Progress Notes (Signed)
Discharge instructions given. Patient verbalized understanding and all questions were answered.  ?

## 2022-09-05 NOTE — Discharge Instructions (Addendum)
Ms. Marissa [name], It was a pleasure taking care of you at [facility]. You were admitted for a stroke and treated with aspirin and plavix. We are discharging you home now that you are doing better. Please follow the following instructions.   1) MRI did show a stroke, and we are discharging you on dual antiplatelet therapy with aspirin and Plavix for the next 21 days.  Please take aspirin 81 mg daily, and Plavix 75 mg daily for the next 21 days.  After 21 days, stop taking Plavix and continue aspirin 81 mg daily indefinitely.  We also have started you on rosuvastatin, which will help lower your cholesterol.  Please take rosuvastatin 20 mg daily.  2) We are sending you home on an EpiPen as well, as we think this could be also related to anaphylaxis.  Please use this if you feel like you are about to pass out and develop hives.  3) Please follow-up with your primary care physician in 1 week.  You also have an appointment with your heart doctor on 09/12/2022.  Please show up to this appointment.  4) You should ask your doctor to see if you need to be tested for carcinoid syndrome.  Continue following with your allergist.  Take care,  Dr. Modena Slater, DO

## 2022-09-06 LAB — BETA-2-GLYCOPROTEIN I ABS, IGG/M/A
Beta-2 Glyco I IgG: <9 GPI IgG units (ref 0–20)
Beta-2-Glycoprotein I IgA: <9 GPI IgA units (ref 0–25)
Beta-2-Glycoprotein I IgM: <9 GPI IgM units (ref 0–32)

## 2022-09-06 LAB — CARDIOLIPIN ANTIBODIES, IGG, IGM, IGA
Anticardiolipin IgA: <9 APL U/mL (ref 0–11)
Anticardiolipin IgG: 9 GPL U/mL (ref 0–14)
Anticardiolipin IgM: <9 MPL U/mL (ref 0–12)

## 2022-09-06 LAB — PROTEIN C, TOTAL: Protein C, Total: 114 % (ref 60–150)

## 2022-09-06 LAB — HIGH SENSITIVITY CRP: CRP, High Sensitivity: 2.73 mg/L (ref 0.00–3.00)

## 2022-09-10 LAB — PROTHROMBIN GENE MUTATION

## 2022-09-12 ENCOUNTER — Ambulatory Visit: Payer: 59 | Admitting: Cardiology

## 2022-09-12 ENCOUNTER — Encounter: Payer: Self-pay | Admitting: Cardiology

## 2022-09-12 VITALS — BP 137/81 | HR 81 | Resp 16 | Ht 61.0 in | Wt 177.0 lb

## 2022-09-12 DIAGNOSIS — I10 Essential (primary) hypertension: Secondary | ICD-10-CM

## 2022-09-12 DIAGNOSIS — R55 Syncope and collapse: Secondary | ICD-10-CM

## 2022-09-12 DIAGNOSIS — E7800 Pure hypercholesterolemia, unspecified: Secondary | ICD-10-CM

## 2022-09-12 MED ORDER — ROSUVASTATIN CALCIUM 5 MG PO TABS
5.0000 mg | ORAL_TABLET | Freq: Every day | ORAL | 0 refills | Status: AC
Start: 2022-09-12 — End: ?

## 2022-09-12 NOTE — Progress Notes (Signed)
Primary Physician/Referring:  [name], MD  Patient ID: Marissa [name], female    DOB: [birth year], 47 y.o.   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  Patient presented to the emergency room as advised by her neurologist due to an episode of syncope that occurred during MRI of brain.  Found to have abnormal brain scan, suggestive of stroke, hence was admitted to the hospital and eventually discharged home the following day with aspirin and Plavix on board.  I was contacted by hospitalist and also neurologist for further management and evaluation.  Patient remains asymptomatic.  Denies any neurologic deficits when she presented to the emergency room.  States that she has been losing weight since being on Mounjaro but weight has plateaued off.  She remains asymptomatic.  Past Medical History:  Diagnosis Date   Back pain    Endometrial polyp    Endometriosis    Hip pain, bilateral    Hypertension    Urticaria    Past Surgical History:  Procedure Laterality Date   CESAREAN SECTION  1995   CESAREAN SECTION N/A 09/26/2016   Procedure: CESAREAN SECTION;  Surgeon: Maxie Better, MD;  [facility];  Service: Obstetrics;  Laterality: N/A;   HYSTEROSCOPY N/A 04/14/2014   Procedure: HYSTEROSCOPY, SUCTION D&C, RESECTION OF MYOMA;  Surgeon: Fermin Schwab, MD;  Location: [facility];  Service: Gynecology;  Laterality: N/A;   LAPAROSCOPY N/A 04/14/2014   Procedure: LAPAROSCOPY, EXCISION AND ABLATION OF ENDOMETRIOSIS, CHROMOTUBATION;  Surgeon: Fermin Schwab, MD;  Location: [facility];  Service: Gynecology;  Laterality: N/A;    TONSILLECTOMY  12-21-2003   12-27-2003--  CAUTERIZATION RIGHT TONSILLAR BLEED POST-OP   Family History  Problem Relation Age of Onset   Hypertension Paternal Grandmother    Stroke Paternal Grandmother    Stroke Mother    Hypertension Mother     Social History   Tobacco Use   Smoking status: Never   Smokeless tobacco: Never  Substance Use Topics   Alcohol use: Yes    Alcohol/week: 1.0 standard drink of alcohol    Types: 1 Glasses of wine per week   Marital Status: Married  ROS  Review of Systems  Cardiovascular:  Negative for chest pain, dyspnea on exertion, leg swelling and syncope.   Objective      09/12/2022    3:40 PM 09/05/2022    3:54 PM 09/05/2022   12:03 PM  Vitals with BMI  Height 5\' 1"     Weight 177 lbs    BMI 33.46    Systolic 137 107 409  Diastolic 81 71 74  Pulse 81 79 77   SpO2: 100 % Orthostatic VS for the past 72 hrs (Last 3 readings):  Patient Position BP Location Cuff Size  09/12/22 1540 Sitting Left Arm Normal     Physical Exam Constitutional:      Appearance: She is obese.  Neck:     Vascular: No carotid bruit or JVD.  Cardiovascular:     Rate and Rhythm: Normal rate and regular rhythm.     Pulses: Intact distal pulses.     Heart sounds: Normal heart sounds. No murmur heard.    No  gallop.  Pulmonary:     Effort: Pulmonary effort is normal.     Breath sounds: Normal breath sounds.  Abdominal:     General: Bowel sounds are normal.     Palpations: Abdomen is soft.  Musculoskeletal:     Right lower leg: No edema.     Left lower leg: No edema.    Medications and allergies   Allergies  Allergen Reactions   Penicillins Hives     Medication list   Current Outpatient Medications:    B Complex Vitamins (B COMPLEX PO), Take 1 tablet by mouth daily., Disp: , Rfl:    buPROPion ER (WELLBUTRIN SR) 100 MG 12 hr tablet, Take 100 mg by mouth daily., Disp: , Rfl:    cetirizine (ZYRTEC) 10 MG tablet, Take 1 tablet (10 mg total) by mouth 2  (two) times daily. (Patient taking differently: Take 10 mg by mouth 2 (two) times daily as needed for allergies.), Disp: 60 tablet, Rfl: 5   Cholecalciferol (VITAMIN D-3 PO), Take 1 tablet by mouth daily., Disp: , Rfl:    EPINEPHrine (EPIPEN 2-PAK) 0.3 mg/0.3 mL IJ SOAJ injection, Inject 0.3 mg into the muscle as needed for up to 1 day for anaphylaxis., Disp: 2 each, Rfl: 0   estrogens, conjugated, (PREMARIN) 0.3 MG tablet, Take 0.3 mg by mouth See admin instructions. 0.3 mg every Tuesday, Thursday., Disp: , Rfl:    famotidine (PEPCID) 20 MG tablet, Take 1 tablet (20 mg total) by mouth 2 (two) times daily. (Patient taking differently: Take 20 mg by mouth 2 (two) times daily as needed (allergies when taking Zyrtec).), Disp: 60 tablet, Rfl: 5   montelukast (SINGULAIR) 10 MG tablet, Take 10 mg by mouth daily as needed (allergies)., Disp: , Rfl:    Prasterone, DHEA, (DHEA PO), Take 1 capsule by mouth daily., Disp: , Rfl:    tirzepatide (MOUNJARO) 10 MG/0.5ML Pen, Inject 10 mg into the skin once a week., Disp: , Rfl:    rosuvastatin (CRESTOR) 5 MG tablet, Take 1 tablet (5 mg total) by mouth daily., Disp: 90 tablet, Rfl: 0 Laboratory examination:   Recent Labs    11/01/21 1552 09/04/22 0933 09/05/22 0254  NA 143 137 138  K 3.6 3.3* 3.7  CL 103 104 105  CO2 25 24 24   GLUCOSE 60* 88 108*  BUN 10 8 12   CREATININE 0.96 0.97 0.94  CALCIUM 9.5 9.3 9.0  GFRNONAA  --  >60 >60    Lab Results  Component Value Date   GLUCOSE 108 (H) 09/05/2022   NA 138 09/05/2022   K 3.7 09/05/2022   CL 105 09/05/2022   CO2 24 09/05/2022   BUN 12 09/05/2022   CREATININE 0.94 09/05/2022   EGFR 75 11/01/2021   CALCIUM 9.0 09/05/2022   PROT 8.0 09/04/2022   ALBUMIN 4.3 09/04/2022   LABGLOB 3.0 11/01/2021   AGRATIO 1.4 11/01/2021   BILITOT 0.6 09/04/2022   ALKPHOS 89 09/04/2022   AST 17 09/04/2022   ALT 15 09/04/2022   ANIONGAP 9 09/05/2022      Lab Results  Component Value Date   ALT 15 09/04/2022    AST 17 09/04/2022   ALKPHOS 89 09/04/2022   BILITOT 0.6 09/04/2022       Latest Ref Rng & Units 09/04/2022    9:33 AM 11/01/2021    3:52 PM 09/07/2021   12:32 AM  Hepatic Function  Total Protein 6.5 - 8.1 g/dL 8.0  7.2  7.1   Albumin 3.5 -  5.0 g/dL 4.3  4.2  4.0   AST 15 - 41 U/L 17  12  10    ALT 0 - 44 U/L 15  9  10    Alk Phosphatase 38 - 126 U/L 89  98  62   Total Bilirubin 0.3 - 1.2 mg/dL 0.6  0.2  0.4    Lab Results  Component Value Date   CHOL 172 09/05/2022   HDL 53 09/05/2022   LDLCALC 108 (H) 09/05/2022   LDLDIRECT 114 (H) 05/01/2013   TRIG 55 09/05/2022   CHOLHDL 3.2 09/05/2022    HEMOGLOBIN A1C Lab Results  Component Value Date   HGBA1C 4.7 (L) 09/04/2022   MPG 88.19 09/04/2022   Lab Results  Component Value Date   TSH 2.978 09/04/2022    External labs:   Cholesterol, total 174.000 M 08/14/2018 HDL 44.000 MG 08/14/2018 LDL 116.000 M 08/14/2018 Triglycerides 69.000 MG 08/14/2018  TSH 2.630 08/14/2018   Radiology:    Cardiac Studies:   EKG:   Normal, seasonal EKG 05/10/2022: Normal sinus rhythm with rate of 75 bpm, normal axis, no evidence of ischemia, normal EKG    Assessment     ICD-10-CM   1. Neurocardiogenic syncope  R55     2. Primary hypertension  I10     3. Mild hypercholesterolemia  E78.00 rosuvastatin (CRESTOR) 5 MG tablet       No orders of the defined types were placed in this encounter.   Meds ordered this encounter  Medications   rosuvastatin (CRESTOR) 5 MG tablet    Sig: Take 1 tablet (5 mg total) by mouth daily.    Dispense:  90 tablet    Refill:  0    Refills to Dr. 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  Patient presented to the emergency room as advised by her neurologist due to an episode of syncope that occurred during MRI of brain.  Found to have abnormal brain scan, suggestive of stroke, hence was admitted to the hospital and eventually discharged home the following day with aspirin and Plavix on board.  I was contacted by hospitalist and also neurologist for further management and evaluation.  1. Neurocardiogenic syncope Unless symptoms of syncope or near syncope suggest neurocardiogenic syncope preceded by feeling faint and warm.  Do not suspect cardiac arrhythmic etiology artery is significant cardiac structural abnormality.  2. Primary hypertension Blood pressure is now well-controlled on present medical regimen.  This will certainly improve with weight loss.  3. Mild hypercholesterolemia She has mild hypercholesterolemia, during hospitalization for presumed stroke, she was started on high intensity statin the 20 mg of Crestor daily, will reduce it back to 5 mg daily.  Extensive review of her medical records from the hospitalization when she presented with syncope revealed no abnormal MRI.  There is no suggestion of TIA or stroke.  Initial MRI without contrast that revealed abnormality suggestive of stroke, but contrast MRI clearly delineated no evidence of any structural abnormality.  I also  reviewed her carotid artery CT angiogram, again revealing no evidence of any atherosclerotic changes or significant stenosis.  Hence I do not suspect she has had TIA or stroke.  Advised her to discontinue aspirin and Plavix.  Continue primary prevention.  Weight loss again discussed.  I will see her back on a as needed basis.  - rosuvastatin (CRESTOR) 5 MG tablet; Take 1 tablet (5 mg  total) by mouth daily.  Dispense: 90 tablet; Refill: 0  Other orders - tirzepatide (MOUNJARO) 10 MG/0.5ML Pen; Inject 10 mg into the skin once a week.    Yates Decamp, MD, [facility] 09/12/2022, 5:00 PM Office: [phone]

## 2022-09-18 ENCOUNTER — Ambulatory Visit: Payer: 59 | Admitting: Cardiology

## 2022-09-20 ENCOUNTER — Ambulatory Visit: Payer: 59 | Attending: Internal Medicine | Admitting: Speech Pathology

## 2022-09-20 LAB — FACTOR 5 LEIDEN

## 2022-09-20 NOTE — Therapy (Deleted)
OUTPATIENT SPEECH LANGUAGE PATHOLOGY EVALUATION   Patient Name: Marissa [name] MRN: [id number] DOB:Aug 19, 1975, 47 y.o., female Today's Date: 09/20/2022  PCP: [name], MDRef Provider (PCP)  REFERRING PROVIDER: Earl Lagos, [facility]GRESS[facility]  END OF SESSION:   Past Medical History:  Diagnosis Date   Back pain    Endometrial polyp    Endometriosis    Hip pain, bilateral    Hypertension    Urticaria    Past Surgical History:  Procedure Laterality Date   CESAREAN SECTION  1995   CESAREAN SECTION N/A 09/26/2016   Procedure: CESAREAN SECTION;  Surgeon: Maxie Better, MD;  [facility];  Service: Obstetrics;  Laterality: N/A;   HYSTEROSCOPY N/A 04/14/2014   Procedure: HYSTEROSCOPY, SUCTION D&C, RESECTION OF MYOMA;  Surgeon: Fermin Schwab, MD;  Location: [facility];  Service: Gynecology;  Laterality: N/A;   LAPAROSCOPY N/A 04/14/2014   Procedure: LAPAROSCOPY, EXCISION AND ABLATION OF ENDOMETRIOSIS, CHROMOTUBATION;  Surgeon: Fermin Schwab, MD;  Location: [facility];  Service: Gynecology;  Laterality: N/A;   TONSILLECTOMY  12-21-2003   12-27-2003--  CAUTERIZATION RIGHT TONSILLAR BLEED POST-OP   Patient Active Problem List   Diagnosis Date Noted   Acute cerebrovascular accident (CVA) (HCC) 09/04/2022   Insulin resistance 02/17/2018   Class 2 severe obesity with serious comorbidity and body mass index (BMI) of 37.0 to 37.9 in adult [facility]) 02/17/2018   Postpartum care following cesarean delivery 7/4 09/26/2016   Status post repeat low transverse cesarean section / failed TOLAC, arrest of dilation, presumed chorioamnionitis, PIH 09/26/2016   Ovarian cyst, right 05/19/2013   Essential hypertension 06/25/2012    ONSET DATE: 09/05/22   REFERRING DIAG:  I63.9 (ICD-10-CM) - Acute cerebrovascular accident (CVA) (HCC)      THERAPY DIAG:  No diagnosis found.  Rationale for Evaluation and Treatment:  {HABREHAB:27488}  SUBJECTIVE:   SUBJECTIVE STATEMENT: *** Pt accompanied by: {accompnied:27141}  PERTINENT HISTORY:Marissa [name] is a 47 y.o. female with a past medical history of HTN, endometriosis who initially had a syncopal episode while in [place] in February. She was subsequently referred to cardiology and then neurology. She had an MRI done 09/03/22 and was told to come to the ED this morning due to it showing an acute infarct. MRI outpatient showed a small infarct in the right frontoparietal region. She states that her last episode was 3 weeks ago. She notes itching, rash, severe diarrhea, becomes light headed and then typically lays down.. Typically happens 3-4 times this year so far. Additionally she has been having trouble with her memory and word finding difficulties.   PAIN:  Are you having pain? {OPRCPAIN:27236}  FALLS: Has patient fallen in last 6 months?  {XBMWUXLK:44010}  LIVING ENVIRONMENT: Lives with: {OPRC lives with:25569::"lives with their family"} Lives in: {Lives in:25570}  PLOF:  Level of assistance: {UVOZDGU:44034} Employment: {SLPemployment:25674}  PATIENT GOALS: ***  OBJECTIVE:   DIAGNOSTIC FINDINGS: ***  COGNITION: Overall cognitive status: {cognition:24006} Areas of impairment:  {cognitiveimpairmentslp:27409} Functional deficits: ***  AUDITORY COMPREHENSION: Overall auditory comprehension: {IMPAIRED:25374} YES/NO questions: {IMPAIRED:25374} Following directions: {IMPAIRED:25374} Conversation: {SLP conversation:25430} Interfering components: {SLP interfering components:25431} Effective technique: {SLP effective technique:25432}  READING COMPREHENSION: {SLPreadingcomprehension:27140}  EXPRESSION: {SLP EXPRESSION:25433}  VERBAL EXPRESSION: Level of generative/spontaneous verbalization: {SLP level of generative/spontaneious verbalization:25435} Automatic speech: {SLP ATOMIC SPEECH:25434}  Repetition:  {SLPrepetion:27212} Naming: {SLPnaming:27214} Pragmatics: {slppragmatics:27216} Comments: *** Interfering components: {SLP INTERFERING COMPONENTS:25436} Effective technique: {SLP EFFECTIVE TECHNIQUE:25437} Non-verbal means of communication: {SLP non verbal means of communication:25438}  WRITTEN EXPRESSION: Dominant hand: {RIGHT/LEFT:20294} Written expression: {slpwrittenexp:27209}  MOTOR SPEECH: Overall motor speech: {slpimpaired:27210} Level of impairment: {SLP level of impairment:25441} Respiration: {respbreathing:27195} Phonation: {SLP phonation:25439} Resonance: {SLP resonance:25440} Articulation: {SLParticulation:27218} Intelligibility: {SLP Intelligible:25442} Motor planning: {slpmotorspeecherrors:27220} Motor speech errors: {SLP motor speech errors:25443} Interfering components: {SLP Interfering components (MS):25444} Effective technique: {SLP effective technique (MS):25445}  ORAL MOTOR EXAMINATION: Overall status: {OMESLP2:27645} Comments: ***  RECOMMENDATIONS FROM OBJECTIVE SWALLOW STUDY (MBSS/FEES):  *** Objective swallow impairments: *** Objective recommended compensations: ***  CLINICAL SWALLOW ASSESSMENT:   Current diet: {slpdiet:27196} Dentition: {dentition:27197} Patient directly observed with POs: {POobserved:27199} Feeding: {slp feeding:27200} Liquids provided by: {SLPliquids:27201} Oral phase signs and symptoms: {SLPoralphase:27202} Pharyngeal phase signs and symptoms: {SLPpharyngealphase:27203} Comments: ***  STANDARDIZED ASSESSMENTS: {SLPstandardizedassessment:27092}  PATIENT REPORTED OUTCOME MEASURES (PROM): {SLPPROM:27095}   TODAY'S TREATMENT:                                                                                                                                         DATE:    09/20/22: ***    PATIENT EDUCATION: Education details: see above Person educated: Patient Education method: Explanation Education comprehension:  verbalized understanding   GOALS: Goals reviewed with patient? {yes/no:20286}  SHORT TERM GOALS: Target date: ***  *** Baseline: Goal status: INITIAL  2.  *** Baseline:  Goal status: INITIAL  3.  *** Baseline:  Goal status: INITIAL  4.  *** Baseline:  Goal status: INITIAL  5.  *** Baseline:  Goal status: INITIAL  6.  *** Baseline:  Goal status: INITIAL  LONG TERM GOALS: Target date: ***  *** Baseline:  Goal status: INITIAL  2.  *** Baseline:  Goal status: INITIAL  3.  *** Baseline:  Goal status: INITIAL  4.  *** Baseline:  Goal status: INITIAL  5.  *** Baseline:  Goal status: INITIAL  6.  *** Baseline:  Goal status: INITIAL  ASSESSMENT:  CLINICAL IMPRESSION: Patient is a 47 y.o. F who was seen today for cognitive linguistic s/p ***. Evaluation reveals {MILD/MOD/MARKED/SEVERE:28894} {SLPOBJIMP:27107}. Pt's {ST domains:29166} is c/b ***. Pt reports ***. Pt would benefit from skilled ST to address aforementioned deficits to {CI outcomes:29167}.    OBJECTIVE IMPAIRMENTS: include {SLPOBJIMP:27107}. These impairments are limiting patient from {SLPLIMIT:27108}. Factors affecting potential to achieve goals and functional outcome are {SLP factors:25450}. Patient will benefit from skilled SLP services to address above impairments and improve overall function.  REHAB POTENTIAL: {rehabpotential:25112}  PLAN:  SLP FREQUENCY: {rehab frequency:25116}  SLP DURATION: {rehab duration:25117}  PLANNED INTERVENTIONS: {SLP treatment/interventions:25449}    [place], Student-SLP 09/20/2022, 8:08 AM

## 2022-09-26 ENCOUNTER — Ambulatory Visit: Payer: 59 | Admitting: Cardiology

## 2022-10-16 IMAGING — US US ABDOMEN LIMITED
1 series · 14 of 25 positions shown · non-contrast
Comparison: 10/03/2020

CLINICAL DATA: Upper abdominal pain

EXAM:
ULTRASOUND ABDOMEN LIMITED RIGHT UPPER QUADRANT

[Series 1: us abdomen limited ruq (liver/gb) · 14 of 44 slices shown]
[im 1/44]
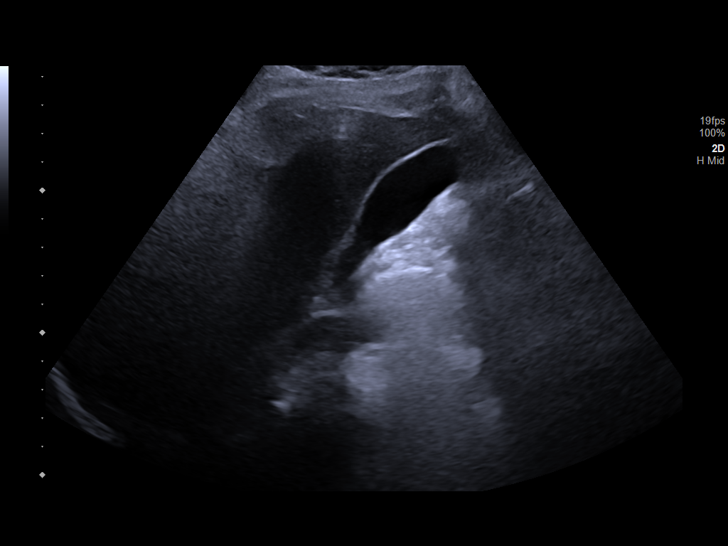
[im 4/44]
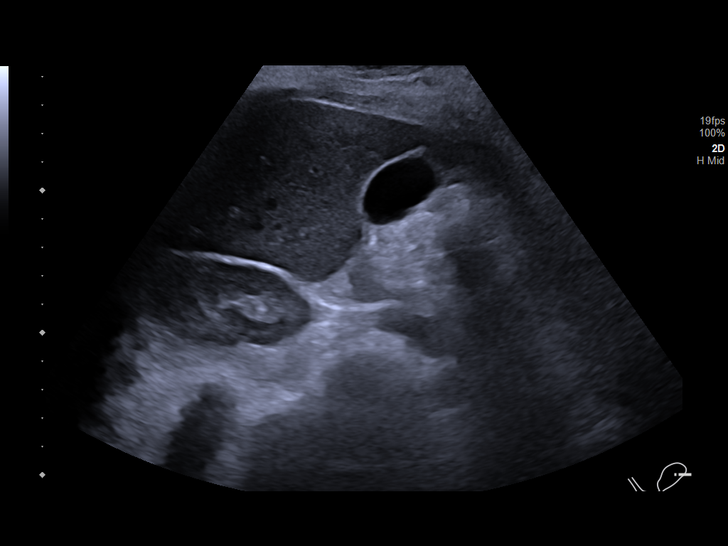
[im 8/44]
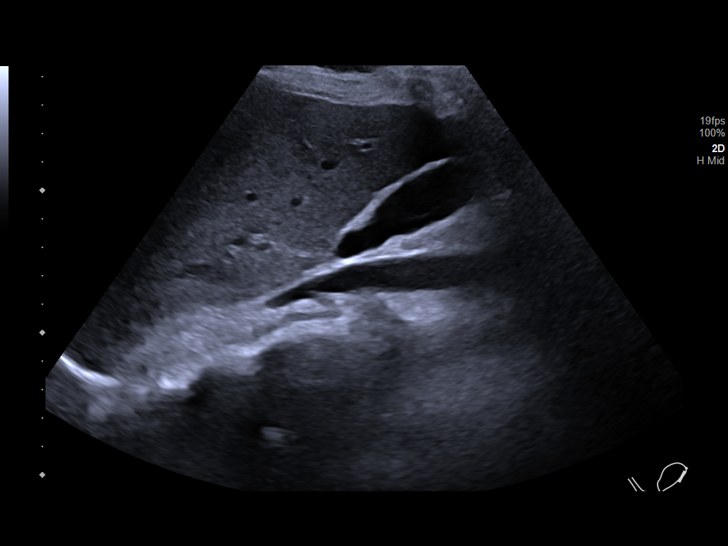
[im 11/44]
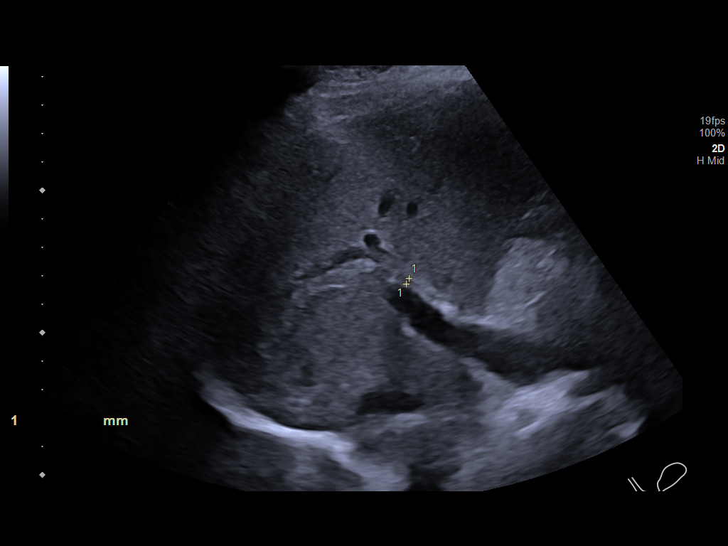
[im 15/44]
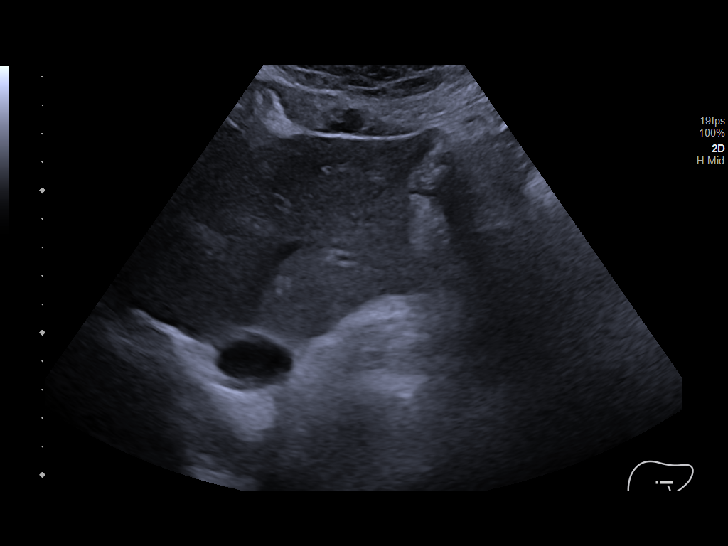
[im 17/44]
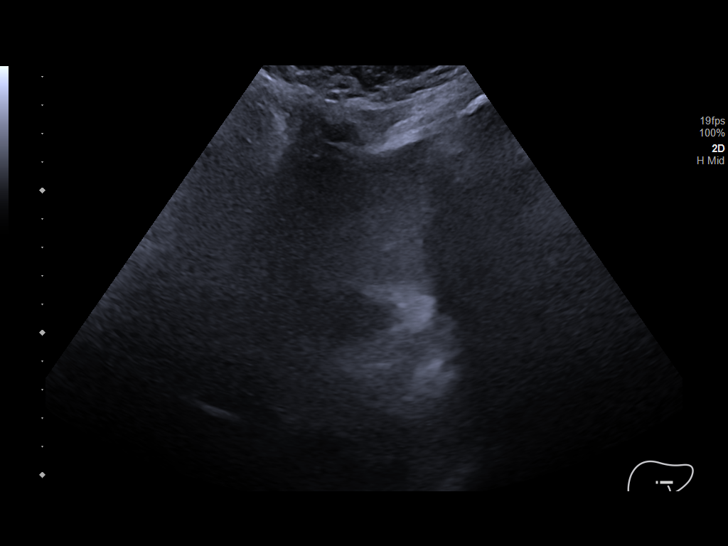
[im 20/44]
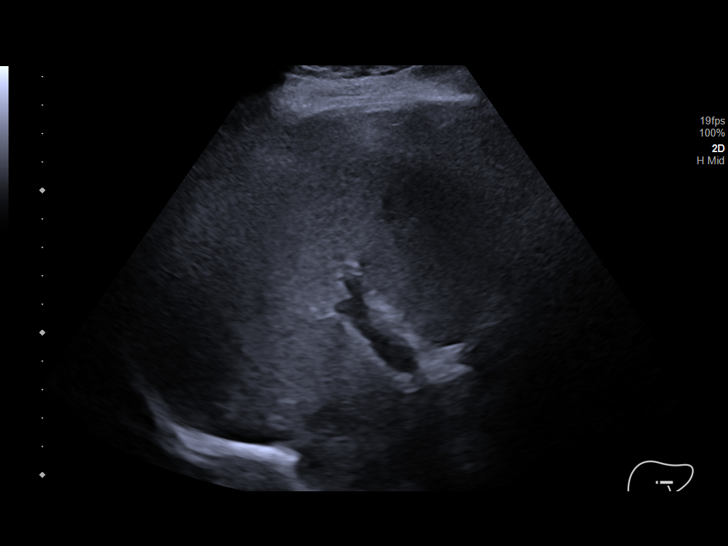
[im 24/44]
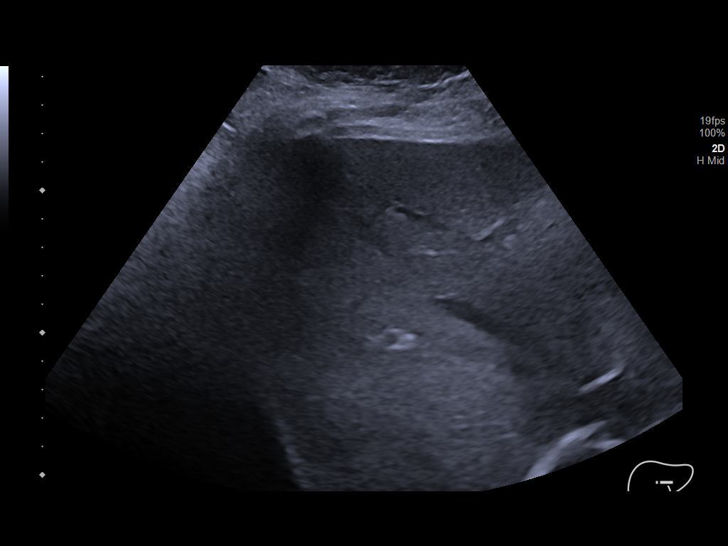
[im 27/44]
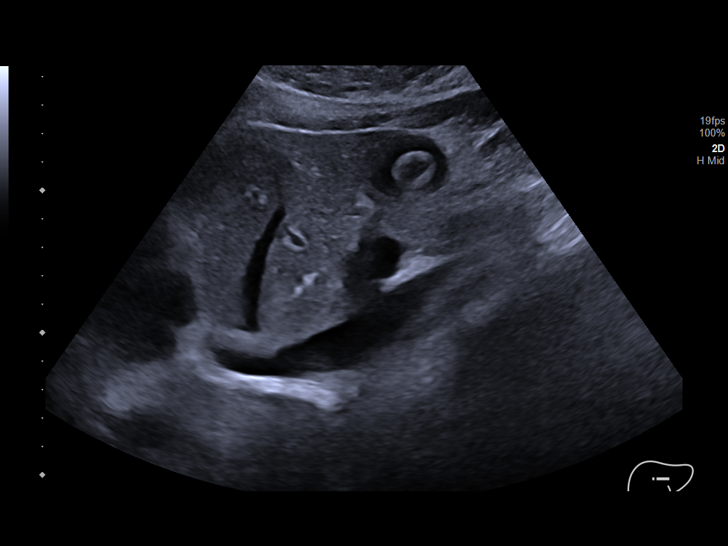
[im 29/44]
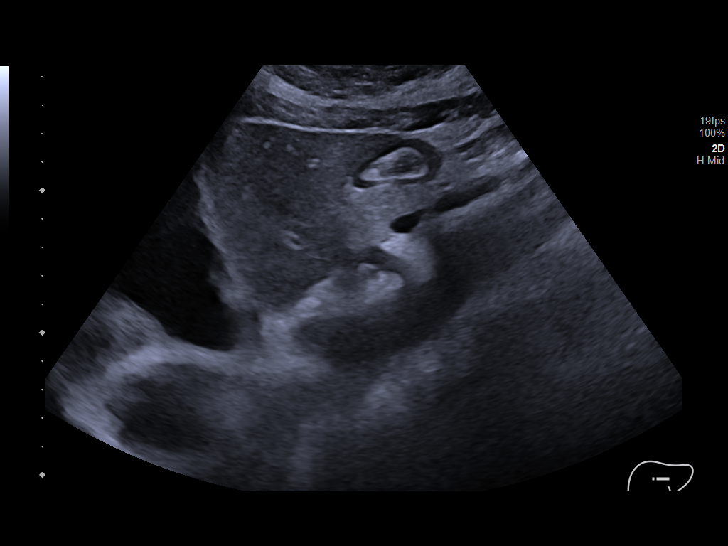
[im 33/44]
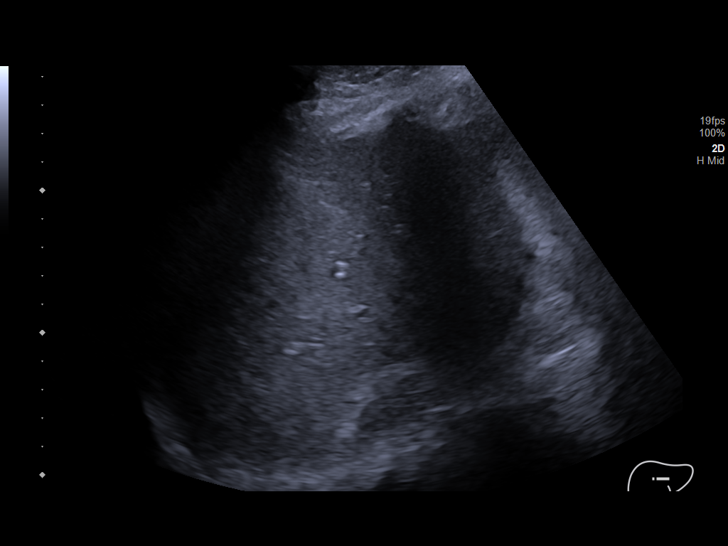
[im 36/44]
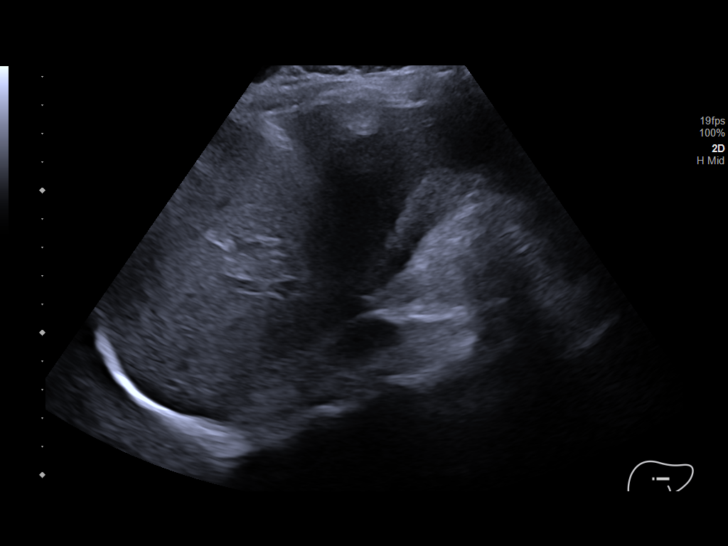
[im 40/44]
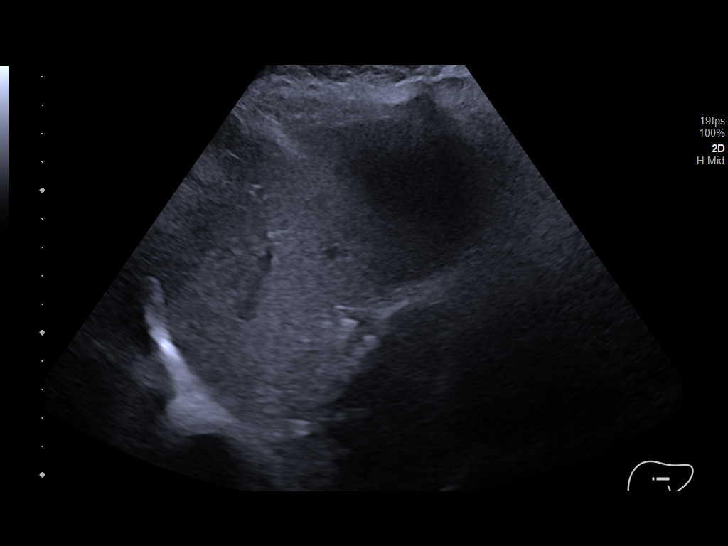
[im 44/44]
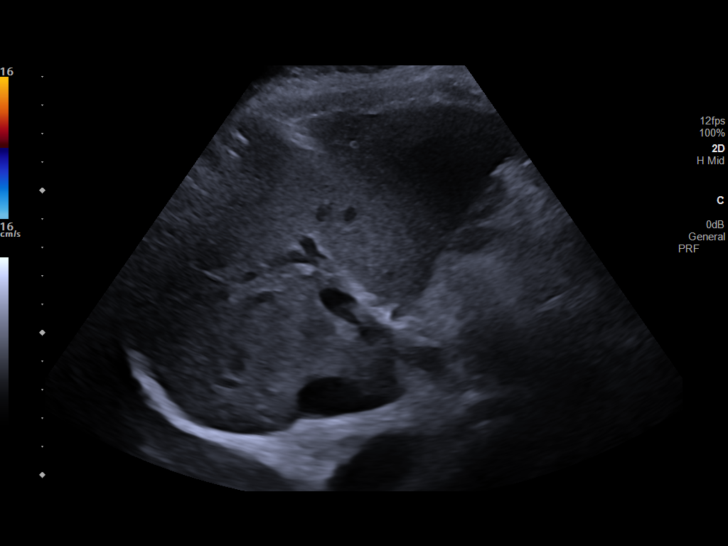

[14 of 25 positions shown; findings below may reference images not displayed]

FINDINGS: Gallbladder:

No gallstones or wall thickening visualized. No sonographic Murphy
sign noted by sonographer.

Common bile duct:

Diameter: 3 mm.

Liver:

No focal lesion identified. Within normal limits in parenchymal
echogenicity. Portal vein is patent on color Doppler imaging with
normal direction of blood flow towards the liver.

Other: None.
IMPRESSION: Unremarkable right upper quadrant ultrasound.

## 2022-10-31 ENCOUNTER — Other Ambulatory Visit (HOSPITAL_COMMUNITY): Payer: Self-pay | Admitting: Endocrinology

## 2022-10-31 DIAGNOSIS — E340 Carcinoid syndrome: Secondary | ICD-10-CM

## 2022-11-15 ENCOUNTER — Ambulatory Visit (HOSPITAL_COMMUNITY)
Admission: RE | Admit: 2022-11-15 | Discharge: 2022-11-15 | Disposition: A | Discharge status: Home / Self Care | Payer: 59 | Source: Ambulatory Visit | Attending: Endocrinology | Admitting: Endocrinology

## 2022-11-15 DIAGNOSIS — E340 Carcinoid syndrome: Secondary | ICD-10-CM | POA: Insufficient documentation

## 2022-11-15 MED ORDER — COPPER CU 64 DOTATATE 1 MCI/ML IV SOLN
4.0000 | Freq: Once | INTRAVENOUS | Status: AC
  Administered 2022-11-15: 3.5 via INTRAVENOUS

## 2022-11-30 ENCOUNTER — Other Ambulatory Visit (HOSPITAL_BASED_OUTPATIENT_CLINIC_OR_DEPARTMENT_OTHER): Payer: Self-pay

## 2023-03-12 ENCOUNTER — Telehealth: Payer: Self-pay | Admitting: Cardiology

## 2023-03-12 NOTE — Telephone Encounter (Addendum)
Spoke with patient and she states she has been having palpitations off and on for a while and they are becoming more frequent. When she is experiencing palpitations she is also SOB and have slight chest pain. Currently asymptomatic.  She does not have any vitals to give. Scheduled appointment with you for 1/15 DOD day. That was the soonest. ED precautions discussed. She would like to know if you can order her a hearrt monitor.

## 2023-03-12 NOTE — Telephone Encounter (Signed)
Patient c/o Palpitations:  STAT if patient reporting lightheadedness, shortness of breath, or chest pain  How long have you had palpitations/irregular HR/ Afib? Are you having the symptoms now? Palpitations, not at this time  Are you currently experiencing lightheadedness, SOB or CP? When she have palpitations, she is short of breath  Do you have a history of afib (atrial fibrillation) or irregular heart rhythm?   Have you checked your BP or HR? (document readings if available):  blood pressure been good  Are you experiencing any other symptoms? No other symptoms- patient wanted an appointment- I made her the first available on 04-26-23- please call to evaluate

## 2023-03-12 NOTE — Telephone Encounter (Signed)
Sure, you can double book at the end of the day also

## 2023-03-13 NOTE — Telephone Encounter (Signed)
I spoke with patient and scheduled her to see Dr Jacinto Halim tomorrow at 10 AM.  12/24 appointment cancelled.

## 2023-03-13 NOTE — Telephone Encounter (Signed)
Spoke with patient  and she will try to come at 1020 on 12/24

## 2023-03-14 ENCOUNTER — Encounter: Payer: Self-pay | Admitting: Cardiology

## 2023-03-14 ENCOUNTER — Ambulatory Visit: Payer: 59 | Attending: Cardiology | Admitting: Cardiology

## 2023-03-14 VITALS — BP 128/86 | HR 84 | Resp 16 | Ht 61.0 in | Wt 187.0 lb

## 2023-03-14 DIAGNOSIS — R002 Palpitations: Secondary | ICD-10-CM

## 2023-03-14 DIAGNOSIS — I10 Essential (primary) hypertension: Secondary | ICD-10-CM

## 2023-03-14 DIAGNOSIS — E7800 Pure hypercholesterolemia, unspecified: Secondary | ICD-10-CM

## 2023-03-14 MED ORDER — TRIAMTERENE-HCTZ 37.5-25 MG PO TABS
0.5000 | ORAL_TABLET | ORAL | 0 refills | Status: DC
Start: 2023-03-14 — End: 2023-09-03

## 2023-03-14 NOTE — Progress Notes (Signed)
Cardiology Office Note:  .   Date:  03/14/2023  ID:  Marissa [name], DOB January 21, 1976, MRN [id number] PCP: [name], MD  [facility] Providers Cardiologist:  Yates Decamp, MD   History of Present Illness: .   Marissa [name] is a 47 y.o.   Discussed the use of AI scribe software for clinical note transcription with the patient, who gave verbal consent to proceed.  History of Present Illness   The patient, with a history of hypertension, presents with heart palpitations and sharp shooting chest pain that occasionally causes breathlessness. These symptoms do not occur daily but have been increasing in frequency. The patient has not identified a specific time of day when these symptoms are more likely to occur, describing them as random. The patient initially suspected that the symptoms might be due to low potassium levels as a result of taking hydrochlorothiazide for hypertension. To address this, she has been taking vitamin D with potassium and eating bananas.  In addition to the cardiac symptoms, the patient has experienced weight gain and hot flashes, suggesting possible perimenopausal changes. The patient has been trying to manage these symptoms by avoiding caffeine and maintaining a regular sleep schedule. The patient also mentions a recent weight loss of 53 pounds, although some weight has been regained.  The patient has been trying to incorporate exercise into her routine, such as walking during lunch breaks. She has not noticed any exacerbation of her cardiac symptoms during these periods of physical activity.      Review of Systems  Cardiovascular:  Positive for palpitations. Negative for chest pain, dyspnea on exertion and leg swelling.    Labs   Lab Results  Component Value Date   CHOL 172 09/05/2022   HDL 53 09/05/2022   LDLCALC 108 (H) 09/05/2022   LDLDIRECT 114 (H) 05/01/2013   TRIG 55 09/05/2022   CHOLHDL 3.2 09/05/2022   Lab Results   Component Value Date   NA 138 09/05/2022   K 3.7 09/05/2022   CO2 24 09/05/2022   GLUCOSE 108 (H) 09/05/2022   BUN 12 09/05/2022   CREATININE 0.94 09/05/2022   CALCIUM 9.0 09/05/2022   EGFR 75 11/01/2021   GFRNONAA >60 09/05/2022      Latest Ref Rng & Units 09/05/2022    2:54 AM 09/04/2022    9:33 AM 11/01/2021    3:52 PM  BMP  Glucose 70 - 99 mg/dL 213  88  60   BUN 6 - 20 mg/dL 12  8  10    Creatinine 0.44 - 1.00 mg/dL 0.86  5.78  4.69   BUN/Creat Ratio 9 - 23   10   Sodium 135 - 145 mmol/L 138  137  143   Potassium 3.5 - 5.1 mmol/L 3.7  3.3  3.6   Chloride 98 - 111 mmol/L 105  104  103   CO2 22 - 32 mmol/L 24  24  25    Calcium 8.9 - 10.3 mg/dL 9.0  9.3  9.5       Latest Ref Rng & Units 09/04/2022    9:33 AM 11/01/2021    3:52 PM 09/08/2021   12:32 AM  CBC  WBC 4.0 - 10.5 K/uL 7.8  9.1  14.5   Hemoglobin 12.0 - 15.0 g/dL 62.9  52.8  41.3   Hematocrit 36.0 - 46.0 % 43.8  40.8  39.6   Platelets 150 - 400 K/uL 286   329     Physical  Exam:   VS:  BP 128/86 (BP Location: Left Arm, Patient Position: Sitting, Cuff Size: Normal)   Pulse 84   Resp 16   Ht 5\' 1"  (1.549 m)   Wt 187 lb (84.8 kg)   SpO2 98%   BMI 35.33 kg/m    Wt Readings from Last 3 Encounters:  03/14/23 187 lb (84.8 kg)  09/12/22 177 lb (80.3 kg)  05/10/22 174 lb 3.2 oz (79 kg)     Physical Exam Constitutional:      Appearance: She is obese.  Neck:     Vascular: No carotid bruit or JVD.  Cardiovascular:     Rate and Rhythm: Normal rate and regular rhythm.     Pulses: Intact distal pulses.     Heart sounds: Normal heart sounds. No murmur heard.    No gallop.  Pulmonary:     Effort: Pulmonary effort is normal.     Breath sounds: Normal breath sounds.  Abdominal:     General: Bowel sounds are normal.     Palpations: Abdomen is soft.  Musculoskeletal:     Right lower leg: No edema.     Left lower leg: No edema.     Studies Reviewed: Marland Kitchen     EKG:    EKG Interpretation Date/Time:  Thursday  March 14 2023 10:21:59 EST Ventricular Rate:  71 PR Interval:  160 QRS Duration:  92 QT Interval:  386 QTC Calculation: 419 R Axis:   0  Text Interpretation: EKG 03/14/2023: Normal sinus rhythm at rate of 71 bpm, normal EKG.  No significant change from 09/04/2022. Confirmed by Delrae Rend [phone]) on 03/14/2023 10:29:20 AM    Medications and allergies    Allergies  Allergen Reactions   Penicillins Hives     Current Outpatient Medications:    aspirin 81 MG chewable tablet, Chew 81 mg by mouth daily., Disp: , Rfl:    B Complex Vitamins (B COMPLEX PO), Take 1 tablet by mouth daily., Disp: , Rfl:    buPROPion ER (WELLBUTRIN SR) 100 MG 12 hr tablet, Take 100 mg by mouth daily., Disp: , Rfl:    cetirizine (ZYRTEC) 10 MG tablet, Take 1 tablet (10 mg total) by mouth 2 (two) times daily. (Patient taking differently: Take 10 mg by mouth 2 (two) times daily as needed for allergies.), Disp: 60 tablet, Rfl: 5   Cholecalciferol (VITAMIN D-3 PO), Take 1 tablet by mouth daily., Disp: , Rfl:    famotidine (PEPCID) 20 MG tablet, Take 1 tablet (20 mg total) by mouth 2 (two) times daily. (Patient taking differently: Take 20 mg by mouth 2 (two) times daily as needed (allergies when taking Zyrtec).), Disp: 60 tablet, Rfl: 5   montelukast (SINGULAIR) 10 MG tablet, Take 10 mg by mouth daily as needed (allergies)., Disp: , Rfl:    rosuvastatin (CRESTOR) 5 MG tablet, Take 1 tablet (5 mg total) by mouth daily., Disp: 90 tablet, Rfl: 0   tirzepatide (MOUNJARO) 10 MG/0.5ML Pen, Inject 10 mg into the skin once a week., Disp: , Rfl:    triamterene-hydrochlorothiazide (MAXZIDE-25) 37.5-25 MG tablet, Take 0.5 tablets by mouth every morning., Disp: 90 tablet, Rfl: 0   potassium chloride SA (KLOR-CON M) 20 MEQ tablet, Take 1 tablet by mouth 2 (two) times daily. (Patient not taking: Reported on 03/14/2023), Disp: , Rfl:    ASSESSMENT AND PLAN: .      ICD-10-CM   1. Primary hypertension  I10 EKG 12-Lead     triamterene-hydrochlorothiazide (MAXZIDE-25) 37.5-25 MG  tablet    2. Palpitations  R00.2     3. Hypercholesteremia  E78.00      Assessment and Plan    Palpitations Random episodes of palpitations with associated chest discomfort and shortness of breath. No specific triggers identified. Episodes not associated with exercise. Likely related to premature ventricular contractions (PVCs) possibly due to stress, hormonal changes, or electrolyte imbalance. No evidence of ischemic heart disease. -No specific intervention recommended at this time.  Hypertension Currently on hydrochlorothiazide, but patient has concerns about potential hypokalemia. -Change hydrochlorothiazide to Maxzide (triamterene-hydrochlorothiazide) to conserve potassium. Prescription to be sent to [facility].  Perimenopausal symptoms Patient reports hot flashes, likely related to perimenopause. -No specific intervention discussed.  Weight management Patient has lost significant weight but desires to lose more. -Encouraged to incorporate more exercise into daily routine for further weight loss.  Hyperlipidemia Patient on rosuvastatin with recent LDL of 94. -Continue rosuvastatin. LDL goal achieved.  Follow-up No specific follow-up time discussed. Patient advised to call as needed.      Signed,  Yates Decamp, MD, [facility] 03/14/2023, 9:39 PM [facility] [address] [place], [place] [address] Phone: [phone]. Fax:  [phone]

## 2023-03-14 NOTE — Patient Instructions (Signed)
Medication Instructions:  Your physician recommends that you continue on your current medications as directed. Please refer to the Current Medication list given to you today.  *If you need a refill on your cardiac medications before your next appointment, please call your pharmacy*   Lab Work: none If you have labs (blood work) drawn today and your tests are completely normal, you will receive your results only by: MyChart Message (if you have MyChart) OR A paper copy in the mail If you have any lab test that is abnormal or we need to change your treatment, we will call you to review the results.   Testing/Procedures: none   Follow-Up: At [facility], you and your health needs are our priority.  As part of our continuing mission to provide you with exceptional heart care, we have created designated Provider Care Teams.  These Care Teams include your primary Cardiologist (physician) and Advanced Practice Providers (APPs -  Physician Assistants and Nurse Practitioners) who all work together to provide you with the care you need, when you need it.  We recommend signing up for the patient portal called "MyChart".  Sign up information is provided on this After Visit Summary.  MyChart is used to connect with patients for Virtual Visits (Telemedicine).  Patients are able to view lab/test results, encounter notes, upcoming appointments, etc.  Non-urgent messages can be sent to your provider as well.   To learn more about what you can do with MyChart, go to [email or web address].    Your next appointment:   As needed  Provider:   Yates Decamp, MD     Other Instructions

## 2023-03-19 ENCOUNTER — Ambulatory Visit: Payer: 59 | Admitting: Cardiology

## 2023-04-10 ENCOUNTER — Ambulatory Visit: Payer: 59 | Admitting: Cardiology

## 2023-04-26 ENCOUNTER — Ambulatory Visit: Payer: 59 | Admitting: Student

## 2023-09-03 ENCOUNTER — Other Ambulatory Visit: Payer: Self-pay | Admitting: Cardiology

## 2023-09-03 DIAGNOSIS — I10 Essential (primary) hypertension: Secondary | ICD-10-CM

## 2024-01-15 ENCOUNTER — Other Ambulatory Visit: Payer: Self-pay | Admitting: Cardiology

## 2024-01-15 DIAGNOSIS — I10 Essential (primary) hypertension: Secondary | ICD-10-CM
